# Patient Record
Sex: Female | Born: 1990 | Race: White | Hispanic: No | Marital: Single | Smoking: Never smoker
Health system: Southern US, Community
[De-identification: ages and names within clinical notes are randomized; demographics above are authoritative.]

## PROBLEM LIST (undated history)

## (undated) DIAGNOSIS — F909 Attention-deficit hyperactivity disorder, unspecified type: Secondary | ICD-10-CM

## (undated) DIAGNOSIS — I456 Pre-excitation syndrome: Secondary | ICD-10-CM

## (undated) HISTORY — PX: TONSILLECTOMY: SUR1361

## (undated) HISTORY — PX: CARDIAC ELECTROPHYSIOLOGY MAPPING AND ABLATION: SHX1292

## (undated) HISTORY — PX: APPENDECTOMY: SHX54

---
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
## 2011-04-08 NOTE — Op Note (Signed)
NAMEMarland Kitchen  ONEDA, DUFFETT           ACCOUNT NO.:  [id number]   MEDICAL RECORD NO.:  [id number]          PATIENT TYPE:  OBV   LOCATION:  2550                         FACILITY:  [facility]   PHYSICIAN:  Wilmon Arms. Corliss Skains, M.D. DATE OF BIRTH:  1991/09/04   DATE OF PROCEDURE:  04/05/2007  DATE OF DISCHARGE:                               OPERATIVE REPORT   PREOPERATIVE DIAGNOSIS:  Acute appendicitis.   POSTOPERATIVE DIAGNOSIS:  Acute appendicitis.   PROCEDURE PERFORMED:  Laparoscopic appendectomy.   SURGEON:  Wilmon Arms. Tsuei, M.D.   ANESTHESIA:  General endotracheal.   INDICATIONS:  The patient is a 20 year old female who presents with 3  days of right lower quadrant pain.  CT scan confirmed the diagnosis of  acute appendicitis.  White count was 9.6.   DESCRIPTION OF PROCEDURE:  The patient was brought to the operating room  and placed in a supine position on the operating room table with her  left arm tucked.  After an adequate level of general anesthesia was  obtained, a Foley catheter was placed under sterile technique.  The  patient's abdomen was prepped with Betadine and draped in a sterile  fashion.  A time-out was taken to ensure the proper patient and proper  procedure.  Her umbilicus was infiltrated with 0.25% Marcaine.  A  curvilinear incision was made below her umbilicus.  Dissection was  carried down to the fascia, which was opened vertically.  The peritoneum  was bluntly entered.  A stay suture of 0 Vicryl was placed around the  fascial opening.  The Hasson cannula was inserted and secured with a  stay suture.  Pneumoperitoneum was obtained by insufflating CO2 and  maintaining a maximum pressure of 15 mmHg.  The laparoscope was inserted  and no gross purulence was noted.  The liver, gallbladder, stomach and  right colon all appeared normal.  A 5-mm port was placed in the right  upper quadrant and a 5-mm port was placed in left lower quadrant.  The  laparoscope was exchanged  for a 5-mm 30-degree scope; this was moved to  the right upper quadrant port site.  The cecum was grasped with a  Glassman clamp and mobilized medially.  We were able to identify a short  thickened appendix with no sign of perforation.  There was some minimal  fibrinous exudate on the appendix.  The appendix was grasped with a  Babcock clamp and elevated.  We were able to bluntly dissect the  appendix way from the surrounding tissue.  The appendix base was  identified.  The mesoappendix was taken with the harmonic scalpel.  The  base of the appendix was then transected with an Endo-GIA stapler.  The  right lower quadrant was then thoroughly irrigated.  The appendix was  placed in the EndoCatch sac and removed through the umbilical port site.  We reinspected the right lower quadrant.  The staple line was intact  with no sign of bleeding or leak.  The irrigant was suctioned out as  much as possible.  We were able to visually identify both ovaries, which  appeared normal with no sign  of cyst.  The uterus appeared normal.  Pneumoperitoneum was then released as the ports were removed under  direct vision.  The fascial opening was closed with the pursestring  suture of the umbilicus.  A 4-0 Monocryl  was used to close all the skin incisions.  Steri-Strips and clean  dressings were applied.  The Foley catheter was removed prior to the  patient's extubation.  She was extubated and brought to Recovery in  stable condition.  All sponge, instrument and needle counts were  correct.      Wilmon Arms. Tsuei, M.D.  Electronically Signed     MKT/MEDQ  D:  04/05/2007  T:  04/06/2007  Job:  [id number]

## 2011-04-08 NOTE — H&P (Signed)
NAMEMarland Kitchen  Bethany Garza, Bethany Garza           ACCOUNT NO.:  [id number]   MEDICAL RECORD NO.:  [id number]          PATIENT TYPE:  OBV   LOCATION:  2550                         FACILITY:  [facility]   PHYSICIAN:  Wilmon Arms. Corliss Skains, M.D. DATE OF BIRTH:  1991-02-24   DATE OF ADMISSION:  04/05/2007  DATE OF DISCHARGE:                              HISTORY & PHYSICAL   CHIEF COMPLAINT:  Right lower quadrant pain   HISTORY OF PRESENT ILLNESS:  The patient is a 20 year old female who is  in good health who began experiencing some right lower quadrant pain  three days ago.  The pain has remained in place and has slowly worsened.  She reports subjective fevers but no objective temperature was taken.  She has some chronic constipation and denies any diarrhea.  She has had  decreased appetite for the last couple of days.  She came to emergency  department after being seen by her primary care physician.  Her primary  care doctor ordered a CT scan performed at [facility] which  showed signs of acute appendicitis.  We were asked to see her in the  emergency department.   PAST MEDICAL HISTORY:  Hyperhidrosis.   PAST SURGICAL HISTORY:  Tonsillectomy.   FAMILY HISTORY:  Noncontributory.   SOCIAL HISTORY:  The patient is a tenth grader.   ALLERGIES:  NONE.   MEDICATIONS:  Glycopyrrolate.   PHYSICAL EXAMINATION:  VITAL SIGNS:  Temperature 97.0, pulse 86,  respirations 16, blood pressure 117/87.  GENERAL:  This is a well-developed, well-nourished female in no apparent  distress.  HEENT:  EOMI.  Sclerae anicteric.  NECK:  No mass or thyromegaly.  LUNGS:  Clear.  Normal respiratory effort.  HEART:  Regular rate and rhythm.  No murmur.  ABDOMEN:  Tender in the right lower quadrant.  Positive Rovsing's sign,  negative psoas sign, positive bowel sounds.  EXTREMITIES:  No edema.  SKIN:  Warm and dry with no sign of jaundice.   LABORATORY DATA:  White count 9.6, 11.5 hemoglobin, platelet count 182.  Electrolytes within normal limits.   IMPRESSION:  Acute appendicitis.   PLAN:  Laparoscopic appendectomy, possible open.  I discussed the  benefits and risks of the procedure with the patient and her mother.  They understand and wish to proceed.      Wilmon Arms. Tsuei, M.D.  Electronically Signed     MKT/MEDQ  D:  04/05/2007  T:  04/06/2007  Job:  [id number]

## 2011-04-11 NOTE — Discharge Summary (Signed)
NAMEMarland Kitchen  LETONYA, MANGELS NO.:  [id number]   MEDICAL RECORD NO.:  [id number]          PATIENT TYPE:  OBV   LOCATION:  6122                         FACILITY:  [facility]   PHYSICIAN:  Cherylynn Ridges, M.D.    DATE OF BIRTH:  04-07-1991   DATE OF ADMISSION:  04/05/2007  DATE OF DISCHARGE:  04/06/2007                               DISCHARGE SUMMARY   DISCHARGING PHYSICIAN:  Cherylynn Ridges, M.D.   CHIEF COMPLAINT/REASON FOR ADMISSION:  Ms. Thull is a 20 year old  female otherwise in good health reporting 3 days of right lower quadrant  pain associated with anorexia.  She initially presented to her primary  care physician who referred her to the ER for concerns of an acute  appendicitis.  CT scan was performed at [facility] which did  demonstrate signs of acute appendicitis.  In the ER the patient was  afebrile, vital signs were stable.  Her abdomen was tender in the right  lower quadrant with positive Rovsing sign.  Negative psoas sign.  Bowel  sounds were present.  Her white count was 9400.  Her CT was consistent  with appendicitis.   ADMITTING DIAGNOSIS:  Acute appendicitis.   HOSPITAL COURSE:  The patient was taken from the ER to the OR where she  underwent laparoscopic appendectomy for acute nonperforated  appendicitis; and was sent back to the general floor to recovery.   Postop day #1 the patient's vital signs were stable.  She was afebrile.  She was tolerating clear liquids and diet was advanced; and she  tolerated this as well as advanced to oral pain medications.  Her  abdomen was soft and flat with bowel sounds present.  She was deemed  appropriate for discharge home.   FINAL DISCHARGE DIAGNOSES:  1. Acute nonperforated appendicitis.  2. Status post laparoscopic appendectomy.   DISCHARGE MEDICATIONS:  1. Vicodin 1-2 tablets q.4 h. as needed for pain.  2. Over-the-counter ibuprofen q.8 h. as needed for pain.  3. Over-the-counter stool softener  twice daily while taking Vicodin to      prevent constipation.   OTHER INSTRUCTIONS:  Please refer to the home care instructions for  laparoscopic appendectomy in the chart.  Additional written instructions  include no driving while taking Vicodin.  Return to school in 1 week.  No PE for 2 weeks.   FOLLOWUP:  You need to follow up with Dr. Corliss Skains in the office in 2-3  weeks.  You need to call for appointment.      Allison L. Rennis Harding, N.P.      Cherylynn Ridges, M.D.  Electronically Signed    ALE/MEDQ  D:  04/28/2007  T:  04/28/2007  Job:  [id number]   cc:   Wilmon Arms. Tsuei, M.D.

## 2011-09-02 LAB — URINALYSIS, ROUTINE W REFLEX MICROSCOPIC
Bilirubin Urine: NEGATIVE
Glucose, UA: NEGATIVE
Hgb urine dipstick: NEGATIVE
Ketones, ur: NEGATIVE
Nitrite: NEGATIVE
Protein, ur: NEGATIVE
Specific Gravity, Urine: 1.024
Urobilinogen, UA: 0.2
pH: 5

## 2011-09-02 LAB — WET PREP, GENITAL
Trich, Wet Prep: NONE SEEN
Yeast Wet Prep HPF POC: NONE SEEN

## 2011-09-02 LAB — GC/CHLAMYDIA PROBE AMP, GENITAL
Chlamydia, DNA Probe: NEGATIVE
GC Probe Amp, Genital: NEGATIVE

## 2011-09-02 LAB — POCT PREGNANCY, URINE
Operator id: 27011
Preg Test, Ur: NEGATIVE

## 2012-08-06 ENCOUNTER — Emergency Department
Admission: EM | Admit: 2012-08-06 | Discharge: 2012-08-06 | Disposition: A | Discharge status: Home / Self Care | Payer: BC Managed Care – PPO | Source: Home / Self Care | Attending: Family Medicine | Admitting: Family Medicine

## 2012-08-06 ENCOUNTER — Encounter: Payer: Self-pay | Admitting: Emergency Medicine

## 2012-08-06 ENCOUNTER — Emergency Department (INDEPENDENT_AMBULATORY_CARE_PROVIDER_SITE_OTHER): Payer: BC Managed Care – PPO

## 2012-08-06 DIAGNOSIS — M7040 Prepatellar bursitis, unspecified knee: Secondary | ICD-10-CM

## 2012-08-06 DIAGNOSIS — W19XXXA Unspecified fall, initial encounter: Secondary | ICD-10-CM

## 2012-08-06 DIAGNOSIS — M7042 Prepatellar bursitis, left knee: Secondary | ICD-10-CM

## 2012-08-06 DIAGNOSIS — M25569 Pain in unspecified knee: Secondary | ICD-10-CM

## 2012-08-06 HISTORY — DX: Attention-deficit hyperactivity disorder, unspecified type: F90.9

## 2012-08-06 HISTORY — DX: Pre-excitation syndrome: I45.6

## 2012-08-06 NOTE — ED Provider Notes (Signed)
History     CSN: [id number]  Arrival date & time 08/06/12  1546   First MD Initiated Contact with Patient 08/06/12 1610      Chief Complaint  Patient presents with  . Knee Pain      HPI Comments: Patient reports falling on left knee directly onto concrete on 07/11/12; after abrasion/scab came off noticed lumps that did not resolve; also has pain which is worse with weight bearing and makes running and kneeling impossible.  Patient is a 21 y.o. female presenting with knee pain. The history is provided by the patient.  Knee Pain This is a new problem. Episode onset: 3 weeks ago. The problem occurs constantly. The problem has been gradually improving. Associated symptoms comments: none. Exacerbated by: flexing left knee and kneeling. Nothing relieves the symptoms. Treatments tried: Ibuprofen. The treatment provided mild relief.    Past Medical History  Diagnosis Date  . ADHD (attention deficit hyperactivity disorder)   . Wolf-Parkinson-White syndrome     Past Surgical History  Procedure Date  . Tonsillectomy   . Appendectomy   . Cardiac electrophysiology mapping and ablation     Family History  Problem Relation Age of Onset  . Diabetes Father     History  Substance Use Topics  . Smoking status: Never Smoker   . Smokeless tobacco: Not on file  . Alcohol Use: Yes    OB History    Grav Para Term Preterm Abortions TAB SAB Ect Mult Living                  Review of Systems  All other systems reviewed and are negative.    Allergies  Review of patient's allergies indicates no known allergies.  Home Medications   Current Outpatient Rx  Name Route Sig Dispense Refill  . AMPHETAMINE-DEXTROAMPHET ER 20 MG PO CP24 Oral Take 20 mg by mouth every morning.    Marland Kitchen AMPHETAMINE-DEXTROAMPHETAMINE 10 MG PO TABS Oral Take 10 mg by mouth continuous as needed.    . IBUPROFEN 400 MG PO TABS Oral Take 400 mg by mouth every 6 (six) hours as needed.    Marland Kitchen LEVONORGESTREL 20 MCG/24HR  IU IUD Intrauterine 1 each by Intrauterine route once.      BP 107/71  Pulse 74  Temp 98 F (36.7 C) (Oral)  Resp 16  Ht 5\' 2"  (1.575 m)  Wt 146 lb 12 oz (66.565 kg)  BMI 26.84 kg/m2  SpO2 98%  Physical Exam  Constitutional: She is oriented to person, place, and time. She appears well-developed and well-nourished. No distress.  HENT:  Head: Normocephalic.  Eyes: Conjunctivae normal are normal. Pupils are equal, round, and reactive to light.  Musculoskeletal: Normal range of motion. She exhibits tenderness.       Left knee: She exhibits swelling and bony tenderness. She exhibits normal range of motion, no effusion, no ecchymosis, no deformity, no laceration, no erythema, normal alignment, no LCL laxity, normal patellar mobility, normal meniscus and no MCL laxity. tenderness found. No medial joint line, no lateral joint line, no MCL, no LCL and no patellar tendon tenderness noted.       Legs:      There is tenderness over lower pole of left patella.  Mild crepitus present in pre-patellar bursa.  No erythema or warmth present.  Neurological: She is alert and oriented to person, place, and time.  Skin: Skin is warm and dry. No rash noted.    ED Course  Procedures  none  Dg Knee 4 Views W/patella Left  08/06/2012  *RADIOLOGY REPORT*  Clinical Data: Fall, knee pain.  LEFT KNEE - COMPLETE 4+ VIEW  Comparison: None  Findings: No acute bony abnormality.  Specifically, no fracture, subluxation, or dislocation.  Soft tissues are intact. Joint spaces are maintained.  Normal bone mineralization.  No joint effusion.  IMPRESSION: Normal study.   Original Report Authenticated By: Cyndie Chime, M.D.      1. Prepatellar bursitis of left knee       MDM  Ace wrap applied. Apply ice pack 2 or 3 times daily.  Wear ace wrap.  Begin knee exercises as per instructions.  May take Aleve, 2 tabs every 12 hours with food. Followup with [facility] if not improving about two weeks.          Lattie Haw, MD 08/11/12 [phone]

## 2012-08-06 NOTE — ED Notes (Signed)
Patient reports falling on left knee directly onto concrete on 07/11/12; after abrasion/scab came off noticed lumps that did not resolve; also has pain which is worse with weight bearing and makes running and kneeling impossible.

## 2013-04-13 IMAGING — CR DG KNEE COMPLETE 4+V*L*
5 series · 5 of 5 positions shown · non-contrast
Comparison: None

CLINICAL DATA: Fall, knee pain.

LEFT KNEE - COMPLETE 4+ VIEW

[view not recorded (1 of 5)]
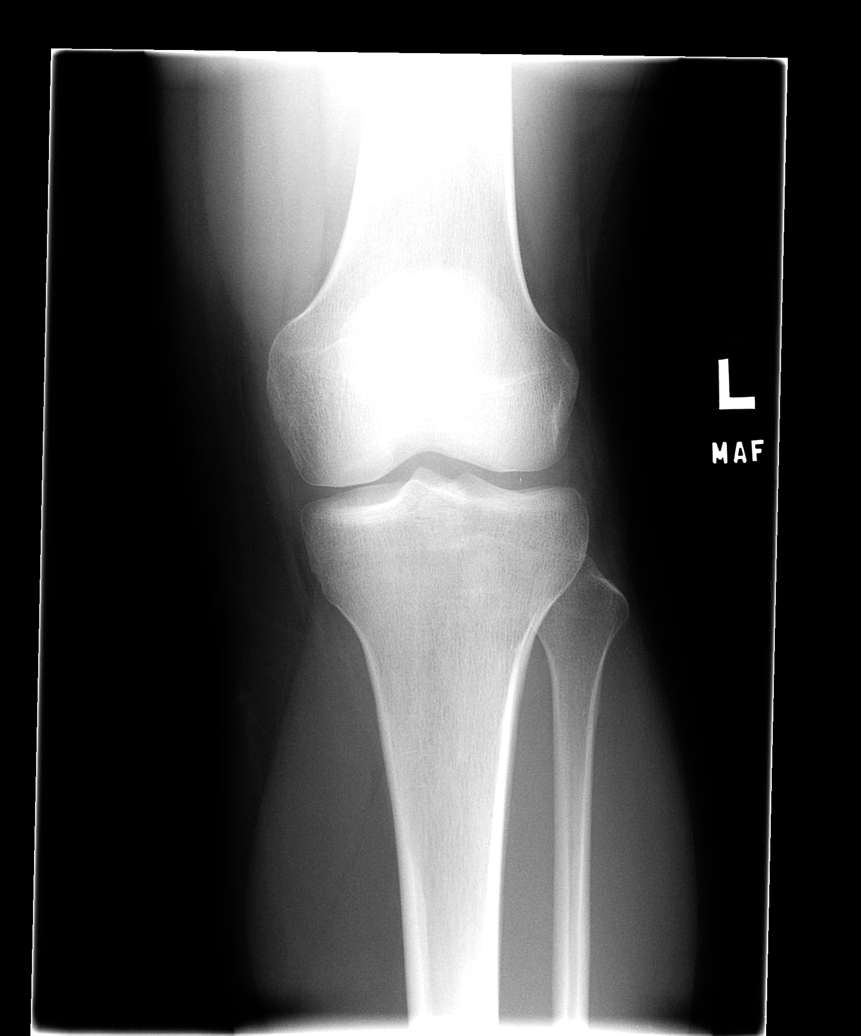

[view not recorded (2 of 5)]
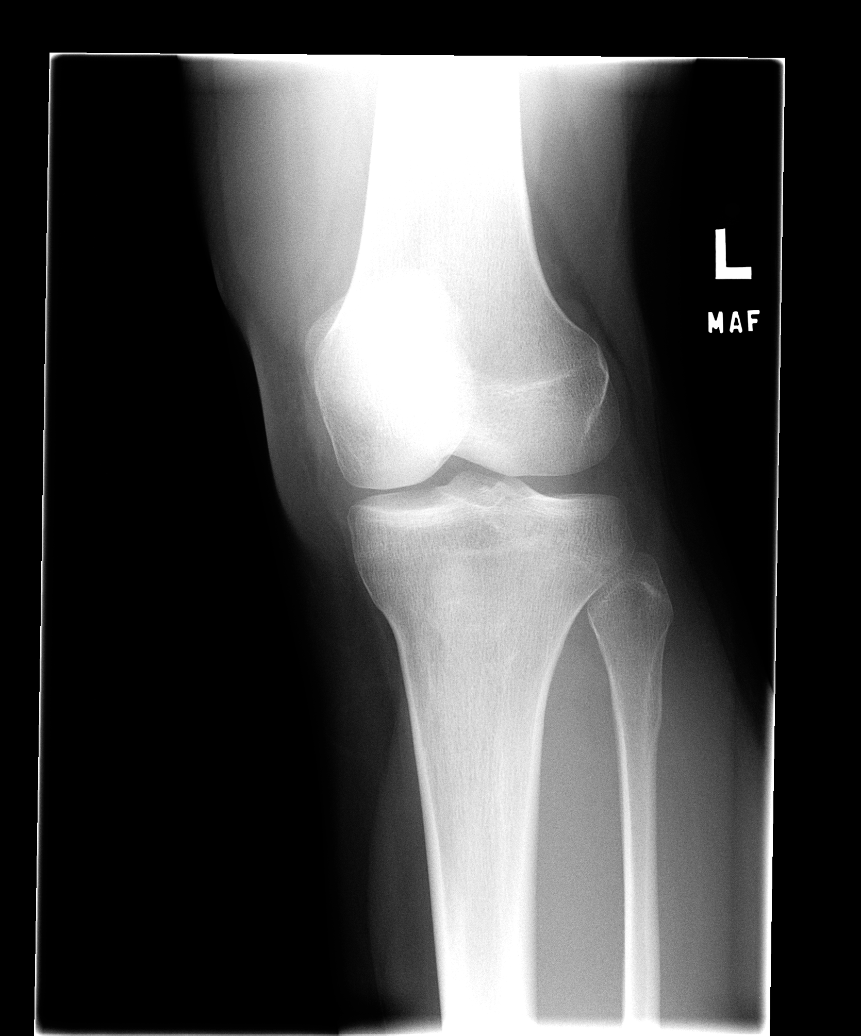

[view not recorded (3 of 5)]
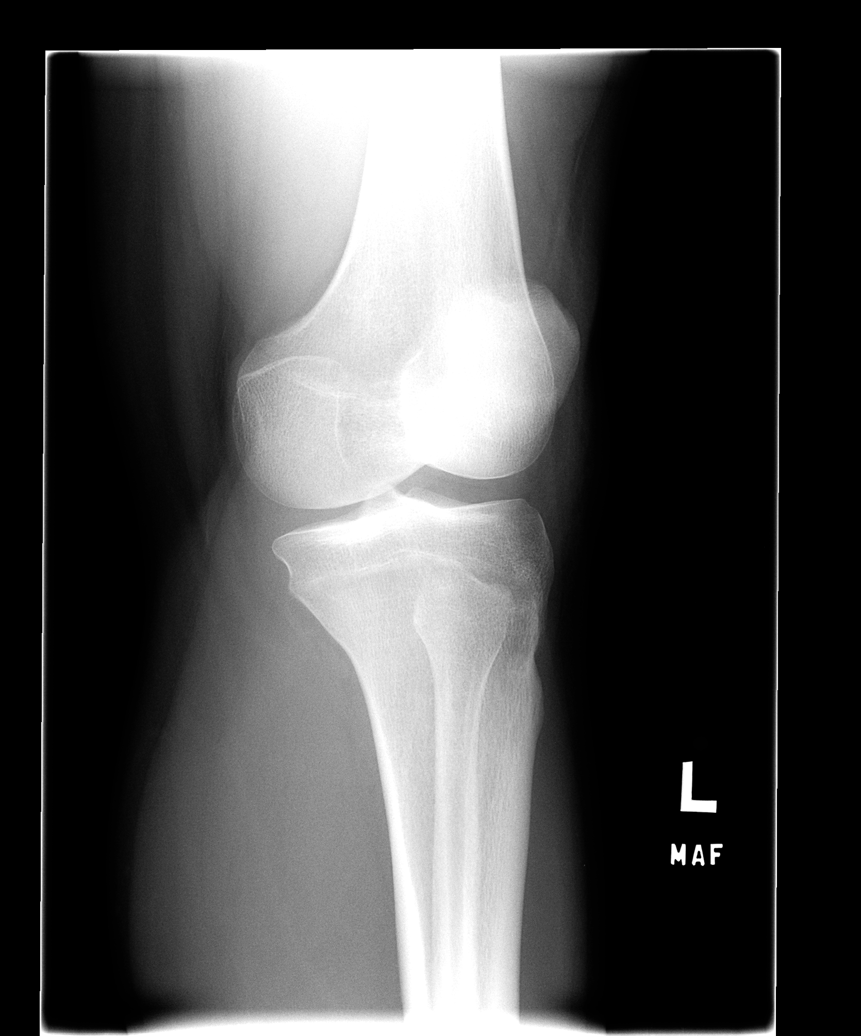

[view not recorded (4 of 5)]
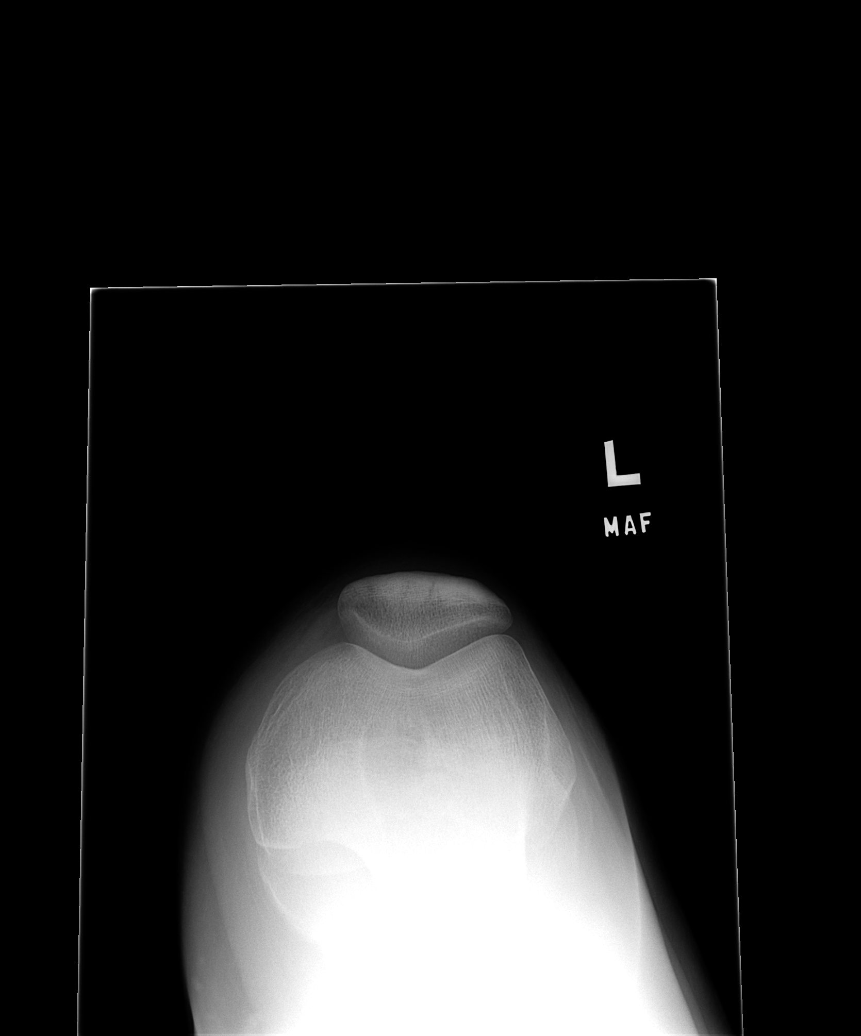

[view not recorded (5 of 5)]
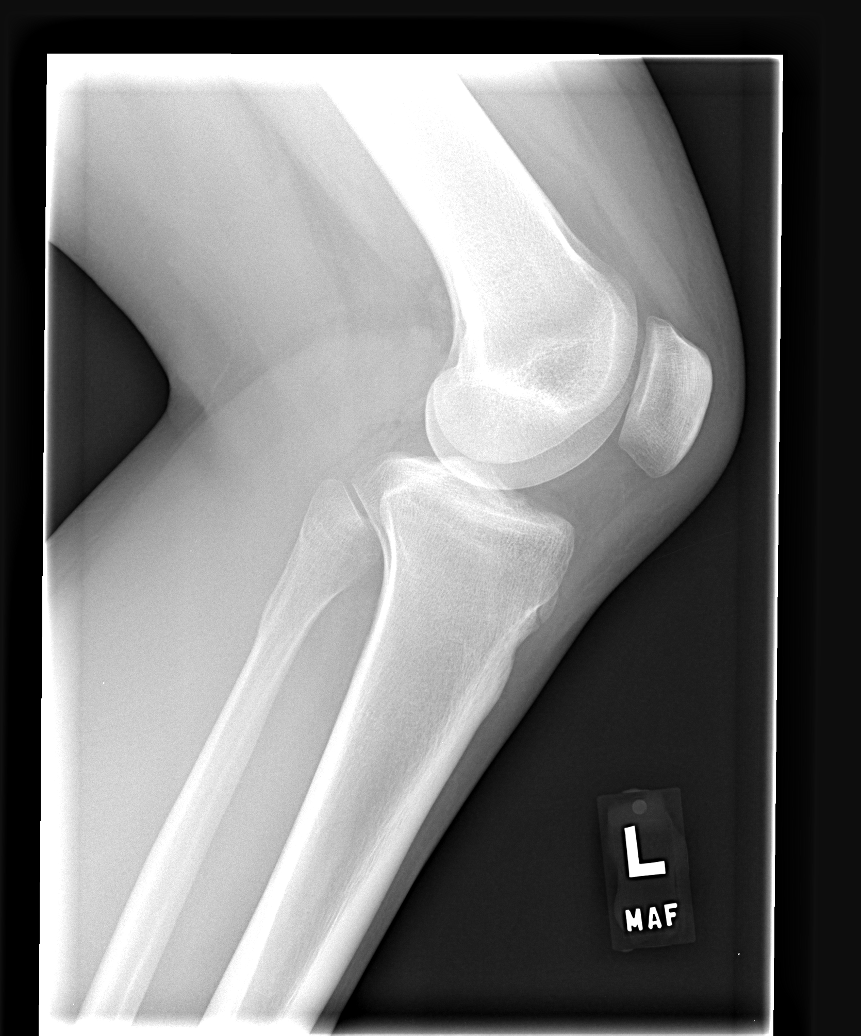

[5 of 5 positions shown; findings below may reference images not displayed]

FINDINGS: No acute bony abnormality.  Specifically, no fracture,
subluxation, or dislocation.  Soft tissues are intact. Joint spaces
are maintained.  Normal bone mineralization.  No joint effusion.
IMPRESSION: Normal study.

## 2014-03-08 DIAGNOSIS — F909 Attention-deficit hyperactivity disorder, unspecified type: Secondary | ICD-10-CM | POA: Insufficient documentation

## 2017-06-09 DIAGNOSIS — Z975 Presence of (intrauterine) contraceptive device: Secondary | ICD-10-CM | POA: Insufficient documentation

## 2018-10-05 ENCOUNTER — Encounter: Payer: Self-pay | Admitting: Family

## 2018-10-05 ENCOUNTER — Ambulatory Visit (INDEPENDENT_AMBULATORY_CARE_PROVIDER_SITE_OTHER): Payer: BLUE CROSS/BLUE SHIELD | Admitting: Family

## 2018-10-05 ENCOUNTER — Telehealth: Payer: Self-pay | Admitting: Family

## 2018-10-05 VITALS — BP 106/71 | HR 70 | Temp 97.3°F | Ht 62.0 in | Wt 153.4 lb

## 2018-10-05 DIAGNOSIS — H00011 Hordeolum externum right upper eyelid: Secondary | ICD-10-CM

## 2018-10-05 MED ORDER — BACITRACIN 500 UNIT/GM OP OINT: 1.0000 "application " | TOPICAL_OINTMENT | Freq: Four times a day (QID) | OPHTHALMIC | 0 refills | Status: DC

## 2018-10-05 MED ORDER — ERYTHROMYCIN 5 MG/GM OP OINT: 1.0000 "application " | TOPICAL_OINTMENT | Freq: Four times a day (QID) | OPHTHALMIC | 0 refills | Status: DC

## 2018-10-05 NOTE — Telephone Encounter (Signed)
Please advise 

## 2018-10-05 NOTE — Progress Notes (Signed)
   Subjective:    Patient ID: Bethany Garza, female    DOB: Feb 25, 1991, 27 y.o.   MRN: [id number]  Chief Complaint  Patient presents with  . irritation right eye  . New Patient (Initial Visit)    Conjunctivitis   The current episode started today. The onset was sudden. The problem has been unchanged. The problem is mild. Associated symptoms include eye itching, photophobia and eye discharge. Pertinent negatives include no decreased vision, no double vision, no ear discharge, no headaches, no hearing loss and no eye redness. The eye pain is mild. The right eye is affected.      Review of Systems  HENT: Negative for ear discharge and hearing loss.   Eyes: Positive for photophobia, discharge and itching. Negative for double vision and redness.  Neurological: Negative for headaches.  All other systems reviewed and are negative.      Objective:   Physical Exam  Constitutional: She is oriented to person, place, and time. She appears well-developed and well-nourished. No distress.  HENT:  Head: Normocephalic and atraumatic.  Right Ear: External ear normal.  Left Ear: External ear normal.  Mouth/Throat: Oropharynx is clear and moist.  Eyes: Pupils are equal, round, and reactive to light. Right eye exhibits hordeolum (swelling in upper lid).  Neck: Normal range of motion. Neck supple. No thyromegaly present.  Cardiovascular: Normal rate, regular rhythm, normal heart sounds and intact distal pulses.  No murmur heard. Pulmonary/Chest: Effort normal and breath sounds normal. No respiratory distress. She has no wheezes.  Abdominal: Soft. Bowel sounds are normal. She exhibits no distension. There is no tenderness.  Musculoskeletal: Normal range of motion. She exhibits no edema or tenderness.  Neurological: She is alert and oriented to person, place, and time. She has normal reflexes. No cranial nerve deficit.  Skin: Skin is warm and dry.  Psychiatric: She has a normal mood and  affect. Her behavior is normal. Judgment and thought content normal.  Vitals reviewed.     BP 106/71   Pulse 70   Temp (!) 97.3 F (36.3 C) (Oral)   Ht 5\' 2"  (1.575 m)   Wt 153 lb 6.4 oz (69.6 kg)   BMI 28.06 kg/m      Assessment & Plan:  Bethany BangStacey N Weilbacher comes in today with chief complaint of irritation right eye and New Patient (Initial Visit)   Diagnosis and orders addressed:  1. Hordeolum of right upper eyelid, unspecified hordeolum type Warm compression Good hand hygiene discussed RTO if symptoms worsen or do not improve - bacitracin ophthalmic ointment; Place 1 application into the right eye 4 (four) times daily. apply to eye  Dispense: 3.5 g; Refill: 0   Jannifer Rodneyhristy Danyel Tobey, FNP

## 2018-10-05 NOTE — Telephone Encounter (Signed)
Erythromycin Prescription sent to pharmacy

## 2018-10-05 NOTE — Patient Instructions (Signed)

## 2018-10-07 ENCOUNTER — Ambulatory Visit (INDEPENDENT_AMBULATORY_CARE_PROVIDER_SITE_OTHER): Payer: BLUE CROSS/BLUE SHIELD | Admitting: Family

## 2018-10-07 ENCOUNTER — Encounter: Payer: Self-pay | Admitting: Family

## 2018-10-07 VITALS — BP 106/76 | HR 86 | Temp 97.2°F | Ht 62.0 in | Wt 153.0 lb

## 2018-10-07 DIAGNOSIS — Z0000 Encounter for general adult medical examination without abnormal findings: Secondary | ICD-10-CM

## 2018-10-07 DIAGNOSIS — Z79899 Other long term (current) drug therapy: Secondary | ICD-10-CM

## 2018-10-07 DIAGNOSIS — F411 Generalized anxiety disorder: Secondary | ICD-10-CM

## 2018-10-07 DIAGNOSIS — Z23 Encounter for immunization: Secondary | ICD-10-CM

## 2018-10-07 DIAGNOSIS — Z020 Encounter for examination for admission to educational institution: Secondary | ICD-10-CM

## 2018-10-07 DIAGNOSIS — F900 Attention-deficit hyperactivity disorder, predominantly inattentive type: Secondary | ICD-10-CM

## 2018-10-07 DIAGNOSIS — F1320 Sedative, hypnotic or anxiolytic dependence, uncomplicated: Secondary | ICD-10-CM

## 2018-10-07 MED ORDER — AMPHETAMINE-DEXTROAMPHET ER 20 MG PO CP24: 20.0000 mg | ORAL_CAPSULE | Freq: Every day | ORAL | 0 refills | Status: DC

## 2018-10-07 MED ORDER — AMPHETAMINE-DEXTROAMPHET ER 20 MG PO CP24
20.0000 mg | ORAL_CAPSULE | Freq: Every day | ORAL | 0 refills | Status: DC
Start: 2018-10-07 — End: 2019-03-22

## 2018-10-07 MED ORDER — AMPHETAMINE-DEXTROAMPHET ER 20 MG PO CP24: 20.0000 mg | ORAL_CAPSULE | ORAL | 0 refills | Status: DC

## 2018-10-07 MED ORDER — AMPHETAMINE-DEXTROAMPHETAMINE 10 MG PO TABS: 10.0000 mg | ORAL_TABLET | ORAL | 0 refills | Status: DC | PRN

## 2018-10-07 MED ORDER — ALPRAZOLAM 0.25 MG PO TABS: 0.2500 mg | ORAL_TABLET | Freq: Every evening | ORAL | 1 refills | Status: DC | PRN

## 2018-10-07 NOTE — Progress Notes (Signed)
Subjective:    Patient ID: Bethany Garza, female    DOB: 08/19/1991, 27 y.o.   MRN: [id number]  Chief Complaint  Patient presents with  . college forms,CPE   Pt presents to the office today to establish care and CPE. 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    She has ADHD and is currently taking Adderall 20 mg or Adderall 10 mg as needed on days she works or is in school. She states this helps her to stay focused and to complete tasks. She has been on these current doses since 2011. Denies any adverse effects.  Anxiety  Presents for initial visit. Onset was more than 5 years ago. The problem has been waxing and waning. Symptoms include decreased concentration, excessive worry, irritability, nervous/anxious behavior and restlessness. Symptoms occur most days. The severity of symptoms is moderate. The quality of sleep is good.   Past treatments include lifestyle changes and benzodiazephines. The treatment provided moderate relief. Compliance with prior treatments has been good.      Review of Systems  Constitutional: Positive for irritability.  Psychiatric/Behavioral: Positive for decreased concentration. The patient is nervous/anxious.   All other systems reviewed and are negative.  Family History  Problem Relation Age of Onset  . Diabetes Father    Social History   Socioeconomic History  . Marital status: Single    Spouse name: Not on file  . Number of children: Not on file  . Years of education: Not on file  . Highest education level: Not on file  Occupational History  . Not on file  Social Needs  . Financial resource strain: Not on file  . Food insecurity:    Worry: Not on file    Inability: Not on file  . Transportation needs:    Medical: Not on file    Non-medical: Not on file  Tobacco Use  . Smoking status: Never Smoker  . Smokeless tobacco: Never Used  Substance and Sexual Activity  .  Alcohol use: Yes  . Drug use: No  . Sexual activity: Not on file  Lifestyle  . Physical activity:    Days per week: Not on file    Minutes per session: Not on file  . Stress: Not on file  Relationships  . Social connections:    Talks on phone: Not on file    Gets together: Not on file    Attends religious service: Not on file    Active member of club or organization: Not on file    Attends meetings of clubs or organizations: Not on file    Relationship status: Not on file  Other Topics Concern  . Not on file  Social History Narrative  . Not on file       Objective:   Physical Exam  Constitutional: She is oriented to person, place, and time. She appears well-developed and well-nourished. No distress.  HENT:  Head: Normocephalic and atraumatic.  Right Ear: External ear normal.  Left Ear: External ear normal.  Mouth/Throat: Oropharynx is clear and moist.  Eyes: Pupils are equal, round, and reactive to light.  Neck: Normal range of motion. Neck supple. No thyromegaly present.  Cardiovascular: Normal rate, regular rhythm, normal heart sounds and intact distal pulses.  No murmur heard. Pulmonary/Chest: Effort normal and breath sounds normal. No respiratory distress. She has no wheezes.  Abdominal: Soft. Bowel sounds are normal. She exhibits no distension. There is no  tenderness.  Musculoskeletal: Normal range of motion. She exhibits no edema or tenderness.  Neurological: She is alert and oriented to person, place, and time. She has normal reflexes. No cranial nerve deficit.  Skin: Skin is warm and dry.  Psychiatric: She has a normal mood and affect. Her behavior is normal. Judgment and thought content normal.  Vitals reviewed.   BP 106/76   Pulse 86   Temp (!) 97.2 F (36.2 C) (Oral)   Ht _0  (1.575 m)   Wt 153 lb (69.4 kg)   BMI 27.98 kg/m      Assessment & Plan:  [name] comes in today with chief complaint of college forms,CPE   Diagnosis and orders  addressed:  1. Annual physical exam - CMP14+EGFR - CBC with Differential/Platelet - Lipid panel - TSH - Varicella zoster antibody, IgG - QuantiFERON-TB Gold Plus  2. Attention deficit hyperactivity disorder (ADHD), predominantly inattentive type Meds as prescribed Behavior modification as needed Follow-up for recheck in 3 months - CMP14+EGFR - CBC with Differential/Platelet - amphetamine-dextroamphetamine (ADDERALL) 10 MG tablet; Take 1 tablet (10 mg total) by mouth continuous as needed.  Dispense: 30 tablet; Refill: 0 - amphetamine-dextroamphetamine (ADDERALL XR) 20 MG 24 hr capsule; Take 1 capsule (20 mg total) by mouth every morning.  Dispense: 30 capsule; Refill: 0 - amphetamine-dextroamphetamine (ADDERALL XR) 20 MG 24 hr capsule; Take 1 capsule (20 mg total) by mouth daily.  Dispense: 30 capsule; Refill: 0 - amphetamine-dextroamphetamine (ADDERALL XR) 20 MG 24 hr capsule; Take 1 capsule (20 mg total) by mouth daily.  Dispense: 30 capsule; Refill: 0  3. GAD (generalized anxiety disorder) Stress management discussed - CMP14+EGFR - CBC with Differential/Platelet - ALPRAZolam (XANAX) 0.25 MG tablet; Take 1 tablet (0.25 mg total) by mouth at bedtime as needed for anxiety.  Dispense: 30 tablet; Refill: 1  4. Encounter for school examination - CMP14+EGFR - Varicella zoster antibody, IgG - QuantiFERON-TB Gold Plus  5. Controlled substance agreement signed  6. Benzodiazepine dependence ([place])   Labs pending Health Maintenance reviewed Diet and exercise encouraged  Follow up plan: 3 months    Evelina Dun, FNP

## 2018-10-07 NOTE — Patient Instructions (Signed)

## 2018-10-08 LAB — CBC WITH DIFFERENTIAL/PLATELET
Basophils Absolute: 0.1 10*3/uL (ref 0.0–0.2)
Basos: 1 %
EOS (ABSOLUTE): 0.1 10*3/uL (ref 0.0–0.4)
Eos: 2 %
Hematocrit: 38.7 % (ref 34.0–46.6)
Hemoglobin: 13.2 g/dL (ref 11.1–15.9)
Immature Grans (Abs): 0 10*3/uL (ref 0.0–0.1)
Immature Granulocytes: 0 %
Lymphocytes Absolute: 2.2 10*3/uL (ref 0.7–3.1)
Lymphs: 40 %
MCH: 30.2 pg (ref 26.6–33.0)
MCHC: 34.1 g/dL (ref 31.5–35.7)
MCV: 89 fL (ref 79–97)
Monocytes Absolute: 0.5 10*3/uL (ref 0.1–0.9)
Monocytes: 10 %
Neutrophils Absolute: 2.6 10*3/uL (ref 1.4–7.0)
Neutrophils: 47 %
Platelets: 207 10*3/uL (ref 150–450)
RBC: 4.37 x10E6/uL (ref 3.77–5.28)
RDW: 12.4 % (ref 12.3–15.4)
WBC: 5.5 10*3/uL (ref 3.4–10.8)

## 2018-10-08 LAB — CMP14+EGFR
ALT: 10 IU/L (ref 0–32)
AST: 17 IU/L (ref 0–40)
Albumin/Globulin Ratio: 2 (ref 1.2–2.2)
Albumin: 4.4 g/dL (ref 3.5–5.5)
Alkaline Phosphatase: 59 IU/L (ref 39–117)
BUN/Creatinine Ratio: 12 (ref 9–23)
BUN: 9 mg/dL (ref 6–20)
Bilirubin Total: 0.3 mg/dL (ref 0.0–1.2)
CO2: 23 mmol/L (ref 20–29)
Calcium: 9.4 mg/dL (ref 8.7–10.2)
Chloride: 104 mmol/L (ref 96–106)
Creatinine, Ser: 0.77 mg/dL (ref 0.57–1.00)
GFR calc Af Amer: 122 mL/min/{1.73_m2} (ref >59)
GFR calc non Af Amer: 106 mL/min/{1.73_m2} (ref >59)
Globulin, Total: 2.2 g/dL (ref 1.5–4.5)
Glucose: 94 mg/dL (ref 65–99)
Potassium: 4.1 mmol/L (ref 3.5–5.2)
Sodium: 144 mmol/L (ref 134–144)
Total Protein: 6.6 g/dL (ref 6.0–8.5)

## 2018-10-08 LAB — LIPID PANEL
Chol/HDL Ratio: 2.9 ratio (ref 0.0–4.4)
Cholesterol, Total: 171 mg/dL (ref 100–199)
HDL: 59 mg/dL (ref >39)
LDL Calculated: 62 mg/dL (ref 0–99)
Triglycerides: 249 mg/dL — ABNORMAL HIGH (ref 0–149)
VLDL Cholesterol Cal: 50 mg/dL — ABNORMAL HIGH (ref 5–40)

## 2018-10-08 LAB — TSH: TSH: 1.4 u[IU]/mL (ref 0.450–4.500)

## 2018-10-08 LAB — VARICELLA ZOSTER ANTIBODY, IGG: Varicella zoster IgG: 1181 index (ref >165)

## 2018-10-11 LAB — QUANTIFERON-TB GOLD PLUS
QuantiFERON Mitogen Value: >10 IU/mL
QuantiFERON Nil Value: 0.04 IU/mL
QuantiFERON TB1 Ag Value: 0.05 IU/mL
QuantiFERON TB2 Ag Value: 0.05 IU/mL
QuantiFERON-TB Gold Plus: NEGATIVE

## 2018-10-12 ENCOUNTER — Encounter: Payer: Self-pay | Admitting: *Deleted

## 2018-11-25 DIAGNOSIS — Z029 Encounter for administrative examinations, unspecified: Secondary | ICD-10-CM

## 2018-12-01 ENCOUNTER — Telehealth: Payer: Self-pay | Admitting: Family

## 2018-12-01 NOTE — Telephone Encounter (Signed)
Informed patient that form  Is in Christy's basket to be signed and we will call her when it is complete

## 2018-12-02 ENCOUNTER — Other Ambulatory Visit: Payer: Self-pay | Admitting: Family

## 2018-12-03 ENCOUNTER — Telehealth: Payer: Self-pay | Admitting: Family

## 2018-12-03 NOTE — Telephone Encounter (Signed)
Neysa Bonito do you know anything about this form?

## 2018-12-07 NOTE — Telephone Encounter (Signed)
lmtcb

## 2018-12-07 NOTE — Telephone Encounter (Signed)
I completed this and was picked up. Not in my office at this time.

## 2018-12-07 NOTE — Telephone Encounter (Signed)
Left message , physical sheet is ready to pick up.  It had went to scan center.

## 2019-01-27 ENCOUNTER — Other Ambulatory Visit: Payer: Self-pay | Admitting: Family Medicine

## 2019-01-27 ENCOUNTER — Encounter: Payer: Self-pay | Admitting: Family Medicine

## 2019-01-27 ENCOUNTER — Ambulatory Visit (INDEPENDENT_AMBULATORY_CARE_PROVIDER_SITE_OTHER): Payer: BLUE CROSS/BLUE SHIELD | Admitting: Family Medicine

## 2019-01-27 ENCOUNTER — Ambulatory Visit (INDEPENDENT_AMBULATORY_CARE_PROVIDER_SITE_OTHER): Payer: BLUE CROSS/BLUE SHIELD

## 2019-01-27 VITALS — BP 105/76 | HR 79 | Temp 98.0°F | Ht 62.0 in | Wt 155.4 lb

## 2019-01-27 DIAGNOSIS — R0781 Pleurodynia: Secondary | ICD-10-CM

## 2019-01-27 MED ORDER — NAPROXEN 500 MG PO TABS: 500.0000 mg | ORAL_TABLET | Freq: Two times a day (BID) | ORAL | 1 refills | Status: DC

## 2019-01-27 NOTE — Patient Instructions (Signed)
Rib Contusion  A rib contusion is a deep bruise on your rib area. Contusions are the result of a blunt trauma that causes bleeding and injury to the tissues under the skin. A rib contusion may involve bruising of the ribs and of the skin and muscles in the area. The skin over the contusion may turn blue, purple, or yellow. Minor injuries will give you a painless contusion. More severe contusions may stay painful and swollen for a few weeks.  What are the causes?  This condition is usually caused by a blow, trauma, or direct force to an area of the body. This often occurs while playing contact sports.  What are the signs or symptoms?  Symptoms of this condition include:   Swelling and redness of the injured area.   Discoloration of the injured area.   Tenderness and soreness of the injured area.   Pain with or without movement.  How is this diagnosed?  This condition may be diagnosed based on:   Your symptoms and medical history.   A physical exam.   Imaging tests-such as an X-ray, CT scan, or MRI-to determine if there were internal injuries or broken bones (fractures).  How is this treated?  This condition may be treated with:   Rest. This is often the best treatment for a rib contusion.   Icing. This reduces swelling and inflammation.   Deep-breathing exercises. These may be recommended to reduce the risk for lung collapse and pneumonia.   Medicines. Over-the-counter or prescription medicines may be given to control pain.   Injection of a numbing medicine around the nerve near your injury (nerve block).  Follow these instructions at home:         Medicines   Take over-the-counter and prescription medicines only as told by your health care provider.   Do not drive or use heavy machinery while taking prescription pain medicine.   If you are taking prescription pain medicine, take actions to prevent or treat constipation. Your health care provider may recommend that you:  ? Drink enough fluid to keep  your urine pale yellow.  ? Eat foods that are high in fiber, such as fresh fruits and vegetables, whole grains, and beans.  ? Limit foods that are high in fat and processed sugars, such as fried or sweet foods.  ? Take an over-the-counter or prescription medicine for constipation.  Managing pain, stiffness, and swelling   If directed, put ice on the injured area:  ? Put ice in a plastic bag.  ? Place a towel between your skin and the bag.  ? Leave the ice on for 20 minutes, 2-3 times a day.   Rest the injured area. Avoid strenuous activity and any activities or movements that cause pain. Be careful during activities and avoid bumping the injured area.   Do not lift anything that is heavier than 5 lb (2.3 kg), or the limit that you are told, until your health care provider says that it is safe.  General instructions   Do not use any products that contain nicotine or tobacco, such as cigarettes and e-cigarettes. These can delay healing. If you need help quitting, ask your health care provider.   Do deep-breathing exercises as told by your health care provider.   If you were given an incentive spirometer, use it every 1-2 hours while you are awake, or as recommended by your health care provider. This device measures how well you are filling your lungs with each breath.     Keep all follow-up visits as told by your health care provider. This is important.  Contact a health care provider if you have:   Increased bruising or swelling.   Pain that is not controlled with treatment.   A fever.  Get help right away if you:   Have difficulty breathing or shortness of breath.   Develop a continual cough or you cough up thick or bloody sputum.   Feel nauseous or you vomit.   Have pain in your abdomen.  Summary   A rib contusion is a deep bruise on your rib area. Contusions are the result of a blunt trauma that causes bleeding and injury to the tissues under the skin.   The skin overlying the contusion may turn  blue, purple, or yellow. Minor injuries may give you a painless contusion. More severe contusions may stay painful and swollen for a few weeks.   Rest the injured area. Avoid strenuous activity and any activities or movements that cause pain.  This information is not intended to replace advice given to you by your health care provider. Make sure you discuss any questions you have with your health care provider.  Document Released: 08/05/2001 Document Revised: 12/09/2017 Document Reviewed: 12/09/2017  Elsevier Interactive Patient Education  2019 [employer]

## 2019-01-27 NOTE — Progress Notes (Signed)
Subjective:  Patient ID: Bethany Garza, female    DOB: 01-01-1991, 28 y.o.   MRN: [id number]  Chief Complaint:  Chest Pain (Left side, friend gave bear hug on Tuesday, pt heard pop, worsening rib pain)   HPI: Bethany Garza is a 28 y.o. female presenting on 01/27/2019 for Chest Pain (Left side, friend gave bear hug on Tuesday, pt heard pop, worsening rib pain)   1. Rib pain on left side   Pt presents today with complaints of left rib pain. Pt states her friend picked her up on Tuesday and squeezed her tight and she felt a pop. Pt states she has had intermittent pain since that time. States the pain is worse with deep breathing and certain movements. States she has been taking ibuprofen with some relief of the pain. No shortness of breath, chest pain, fever, chills, palpitations or cough.    Relevant past medical, surgical, family, and social history reviewed and updated as indicated.  Allergies and medications reviewed and updated.   Past Medical History:  Diagnosis Date  . ADHD (attention deficit hyperactivity disorder)   . Wolf-Parkinson-White syndrome     Past Surgical History:  Procedure Laterality Date  . APPENDECTOMY    . CARDIAC ELECTROPHYSIOLOGY MAPPING AND ABLATION    . TONSILLECTOMY      Social History   Socioeconomic History  . Marital status: Single    Spouse name: Not on file  . Number of children: Not on file  . Years of education: Not on file  . Highest education level: Not on file  Occupational History  . Not on file  Social Needs  . Financial resource strain: Not on file  . Food insecurity:    Worry: Not on file    Inability: Not on file  . Transportation needs:    Medical: Not on file    Non-medical: Not on file  Tobacco Use  . Smoking status: Never Smoker  . Smokeless tobacco: Never Used  Substance and Sexual Activity  . Alcohol use: Yes  . Drug use: No  . Sexual activity: Not on file  Lifestyle  . Physical activity:    Days  per week: Not on file    Minutes per session: Not on file  . Stress: Not on file  Relationships  . Social connections:    Talks on phone: Not on file    Gets together: Not on file    Attends religious service: Not on file    Active member of club or organization: Not on file    Attends meetings of clubs or organizations: Not on file    Relationship status: Not on file  . Intimate partner violence:    Fear of current or ex partner: Not on file    Emotionally abused: Not on file    Physically abused: Not on file    Forced sexual activity: Not on file  Other Topics Concern  . Not on file  Social History Narrative  . Not on file    Outpatient Encounter Medications as of 01/27/2019  Medication Sig  . ALPRAZolam (XANAX) 0.25 MG tablet Take 1 tablet (0.25 mg total) by mouth at bedtime as needed for anxiety.  Marland Kitchen amphetamine-dextroamphetamine (ADDERALL XR) 20 MG 24 hr capsule Take 1 capsule (20 mg total) by mouth every morning.  Marland Kitchen amphetamine-dextroamphetamine (ADDERALL) 10 MG tablet Take 1 tablet (10 mg total) by mouth continuous as needed.  Marland Kitchen levonorgestrel (MIRENA) 20 MCG/24HR IUD 1 each  by Intrauterine route once.  Marland Kitchen amphetamine-dextroamphetamine (ADDERALL XR) 20 MG 24 hr capsule Take 1 capsule (20 mg total) by mouth daily. (Patient not taking: Reported on 01/27/2019)  . amphetamine-dextroamphetamine (ADDERALL XR) 20 MG 24 hr capsule Take 1 capsule (20 mg total) by mouth daily. (Patient not taking: Reported on 01/27/2019)  . ibuprofen (ADVIL,MOTRIN) 400 MG tablet Take 400 mg by mouth every 6 (six) hours as needed.  . naproxen (NAPROSYN) 500 MG tablet Take 1 tablet (500 mg total) by mouth 2 (two) times daily with a meal.  . [DISCONTINUED] erythromycin ophthalmic ointment Place 1 application into the right eye 4 (four) times daily.   No facility-administered encounter medications on file as of 01/27/2019.     No Known Allergies  Review of Systems  Constitutional: Negative for chills, fatigue  and fever.  Respiratory: Negative for cough, chest tightness and shortness of breath.   Cardiovascular: Negative for chest pain and palpitations.       Left rib pain  Skin: Negative for wound.  Psychiatric/Behavioral: Negative for confusion.  All other systems reviewed and are negative.       Objective:  BP 105/76   Pulse 79   Temp 98 F (36.7 C) (Oral)   Ht 5' 2"  (1.575 m)   Wt 155 lb 6.4 oz (70.5 kg)   BMI 28.42 kg/m    Wt Readings from Last 3 Encounters:  01/27/19 155 lb 6.4 oz (70.5 kg)  10/07/18 153 lb (69.4 kg)  10/05/18 153 lb 6.4 oz (69.6 kg)    Physical Exam Vitals signs and nursing note reviewed.  Constitutional:      General: She is not in acute distress.    Appearance: Normal appearance. She is well-developed. She is not ill-appearing or toxic-appearing.  HENT:     Head: Normocephalic and atraumatic.     Mouth/Throat:     Mouth: Mucous membranes are moist.     Pharynx: Oropharynx is clear.  Eyes:     Conjunctiva/sclera: Conjunctivae normal.     Pupils: Pupils are equal, round, and reactive to light.  Neck:     Musculoskeletal: Normal range of motion and neck supple.  Cardiovascular:     Rate and Rhythm: Normal rate and regular rhythm.     Chest Wall: PMI is not displaced.     Heart sounds: Normal heart sounds. No murmur. No friction rub. No gallop.   Pulmonary:     Effort: Pulmonary effort is normal. No accessory muscle usage or respiratory distress.     Breath sounds: Normal breath sounds.  Chest:     Chest wall: Tenderness (under left breast) present. No mass, deformity, crepitus or edema. There is no dullness to percussion.    Skin:    General: Skin is warm and dry.     Capillary Refill: Capillary refill takes less than 2 seconds.  Neurological:     General: No focal deficit present.     Mental Status: She is alert and oriented to person, place, and time.  Psychiatric:        Mood and Affect: Mood normal.        Behavior: Behavior normal.  Behavior is cooperative.        Thought Content: Thought content normal.        Judgment: Judgment normal.     Results for orders placed or performed in visit on 10/07/18  CMP14+EGFR  Result Value Ref Range   Glucose 94 65 - 99 mg/dL   BUN 9  6 - 20 mg/dL   Creatinine, Ser 0.77 0.57 - 1.00 mg/dL   GFR calc non Af Amer 106 >59 mL/min/1.73   GFR calc Af Amer 122 >59 mL/min/1.73   BUN/Creatinine Ratio 12 9 - 23   Sodium 144 134 - 144 mmol/L   Potassium 4.1 3.5 - 5.2 mmol/L   Chloride 104 96 - 106 mmol/L   CO2 23 20 - 29 mmol/L   Calcium 9.4 8.7 - 10.2 mg/dL   Total Protein 6.6 6.0 - 8.5 g/dL   Albumin 4.4 3.5 - 5.5 g/dL   Globulin, Total 2.2 1.5 - 4.5 g/dL   Albumin/Globulin Ratio 2.0 1.2 - 2.2   Bilirubin Total 0.3 0.0 - 1.2 mg/dL   Alkaline Phosphatase 59 39 - 117 IU/L   AST 17 0 - 40 IU/L   ALT 10 0 - 32 IU/L  CBC with Differential/Platelet  Result Value Ref Range   WBC 5.5 3.4 - 10.8 x10E3/uL   RBC 4.37 3.77 - 5.28 x10E6/uL   Hemoglobin 13.2 11.1 - 15.9 g/dL   Hematocrit 38.7 34.0 - 46.6 %   MCV 89 79 - 97 fL   MCH 30.2 26.6 - 33.0 pg   MCHC 34.1 31.5 - 35.7 g/dL   RDW 12.4 12.3 - 15.4 %   Platelets 207 150 - 450 x10E3/uL   Neutrophils 47 Not Estab. %   Lymphs 40 Not Estab. %   Monocytes 10 Not Estab. %   Eos 2 Not Estab. %   Basos 1 Not Estab. %   Neutrophils Absolute 2.6 1.4 - 7.0 x10E3/uL   Lymphocytes Absolute 2.2 0.7 - 3.1 x10E3/uL   Monocytes Absolute 0.5 0.1 - 0.9 x10E3/uL   EOS (ABSOLUTE) 0.1 0.0 - 0.4 x10E3/uL   Basophils Absolute 0.1 0.0 - 0.2 x10E3/uL   Immature Granulocytes 0 Not Estab. %   Immature Grans (Abs) 0.0 0.0 - 0.1 x10E3/uL  Lipid panel  Result Value Ref Range   Cholesterol, Total 171 100 - 199 mg/dL   Triglycerides 249 (H) 0 - 149 mg/dL   HDL 59 >39 mg/dL   VLDL Cholesterol Cal 50 (H) 5 - 40 mg/dL   LDL Calculated 62 0 - 99 mg/dL   Chol/HDL Ratio 2.9 0.0 - 4.4 ratio  TSH  Result Value Ref Range   TSH 1.400 0.450 - 4.500 uIU/mL    Varicella zoster antibody, IgG  Result Value Ref Range   Varicella zoster IgG 1,181 Immune >165 index  QuantiFERON-TB Gold Plus  Result Value Ref Range   QuantiFERON Incubation Incubation performed.    QuantiFERON Criteria Comment    QuantiFERON TB1 Ag Value 0.05 IU/mL   QuantiFERON TB2 Ag Value 0.05 IU/mL   QuantiFERON Nil Value 0.04 IU/mL   QuantiFERON Mitogen Value >10.00 IU/mL   QuantiFERON-TB Gold Plus Negative Negative     Left rib xray negative per radiology reading.   Pertinent labs & imaging results that were available during my care of the patient were reviewed by me and considered in my medical decision making.  Assessment & Plan:  Zuley was seen today for chest pain.  Diagnoses and all orders for this visit:  Rib pain on left side Negative xray in office today. Symptomatic care discussed. Medications as prescribed. Report any new or worsening symptoms.  -     naproxen (NAPROSYN) 500 MG tablet; Take 1 tablet (500 mg total) by mouth 2 (two) times daily with a meal.     Continue all other maintenance medications.  Follow up plan: Return if symptoms worsen or fail to improve.  Educational handout given for rib contusion   The above assessment and management plan was discussed with the patient. The patient verbalized understanding of and has agreed to the management plan. Patient is aware to call the clinic if symptoms persist or worsen. Patient is aware when to return to the clinic for a follow-up visit. Patient educated on when it is appropriate to go to the emergency department.   Monia Pouch, FNP-C [facility] [phone]

## 2019-03-22 ENCOUNTER — Ambulatory Visit (INDEPENDENT_AMBULATORY_CARE_PROVIDER_SITE_OTHER): Payer: BLUE CROSS/BLUE SHIELD | Admitting: Family

## 2019-03-22 ENCOUNTER — Other Ambulatory Visit: Payer: Self-pay

## 2019-03-22 ENCOUNTER — Encounter: Payer: Self-pay | Admitting: Family

## 2019-03-22 DIAGNOSIS — F1320 Sedative, hypnotic or anxiolytic dependence, uncomplicated: Secondary | ICD-10-CM

## 2019-03-22 DIAGNOSIS — F411 Generalized anxiety disorder: Secondary | ICD-10-CM | POA: Diagnosis not present

## 2019-03-22 DIAGNOSIS — Z79899 Other long term (current) drug therapy: Secondary | ICD-10-CM

## 2019-03-22 DIAGNOSIS — F909 Attention-deficit hyperactivity disorder, unspecified type: Secondary | ICD-10-CM | POA: Diagnosis not present

## 2019-03-22 DIAGNOSIS — F320 Major depressive disorder, single episode, mild: Secondary | ICD-10-CM

## 2019-03-22 MED ORDER — ESCITALOPRAM OXALATE 10 MG PO TABS: 10.0000 mg | ORAL_TABLET | Freq: Every day | ORAL | 3 refills | Status: DC

## 2019-03-22 MED ORDER — AMPHETAMINE-DEXTROAMPHET ER 20 MG PO CP24: 20.0000 mg | ORAL_CAPSULE | ORAL | 0 refills | Status: DC

## 2019-03-22 MED ORDER — AMPHETAMINE-DEXTROAMPHET ER 20 MG PO CP24: 20.0000 mg | ORAL_CAPSULE | Freq: Every day | ORAL | 0 refills | Status: DC

## 2019-03-22 MED ORDER — ALPRAZOLAM 0.25 MG PO TABS: 0.2500 mg | ORAL_TABLET | Freq: Every evening | ORAL | 1 refills | Status: DC | PRN

## 2019-03-22 MED ORDER — AMPHETAMINE-DEXTROAMPHETAMINE 10 MG PO TABS: 10.0000 mg | ORAL_TABLET | ORAL | 0 refills | Status: DC | PRN

## 2019-03-22 NOTE — Progress Notes (Signed)
Virtual Visit via telephone Note  I connected with Bethany Garza on 03/22/19 at 9:58 Am by telephone and verified that I am speaking with the correct person using two identifiers. Bethany Garza is currently located at home  and no one  is currently with her during visit. The provider, Jannifer Rodney, FNP is located in their office at time of visit.  I discussed the limitations, risks, security and privacy concerns of performing an evaluation and management service by telephone and the availability of in person appointments. I also discussed with the patient that there may be a patient responsible charge related to this service. The patient expressed understanding and agreed to proceed.   History and Present Illness:   Pt calls today for for chronic follow up. She states she has recently found out her dad has lung cancer with mets to his brain.  Anxiety  Presents for follow-up visit. Symptoms include decreased concentration, excessive worry, irritability, nervous/anxious behavior and restlessness. Symptoms occur most days. The severity of symptoms is moderate. The quality of sleep is good.    Depression         This is a recurrent problem.  The current episode started more than 1 year ago.   The onset quality is gradual.   The problem occurs intermittently.  The problem has been waxing and waning since onset.  Associated symptoms include decreased concentration, irritable, restlessness, decreased interest, appetite change and sad.  Associated symptoms include no helplessness and no hopelessness.  Past treatments include nothing.  Past medical history includes anxiety.   ADHD PT currently taking Adderall 20 mg and states she takes this while working and do well. States this helps her stay on task and focus at work.     Review of Systems  Constitutional: Positive for appetite change and irritability.  Psychiatric/Behavioral: Positive for decreased concentration and depression. The  patient is nervous/anxious.   All other systems reviewed and are negative.   Observations/Objective: No SOB or distress noted  Assessment and Plan: Bethany Garza comes in today with chief complaint of No chief complaint on file.   Diagnosis and orders addressed:  1. Attention deficit hyperactivity disorder (ADHD), unspecified ADHD type Meds as prescribed Behavior modification as needed Follow-up for recheck in 3 months - amphetamine-dextroamphetamine (ADDERALL XR) 20 MG 24 hr capsule; Take 1 capsule (20 mg total) by mouth every morning.  Dispense: 30 capsule; Refill: 0 - amphetamine-dextroamphetamine (ADDERALL) 10 MG tablet; Take 1 tablet (10 mg total) by mouth continuous as needed.  Dispense: 30 tablet; Refill: 0 - amphetamine-dextroamphetamine (ADDERALL XR) 20 MG 24 hr capsule; Take 1 capsule (20 mg total) by mouth daily.  Dispense: 30 capsule; Refill: 0 - amphetamine-dextroamphetamine (ADDERALL XR) 20 MG 24 hr capsule; Take 1 capsule (20 mg total) by mouth daily.  Dispense: 30 capsule; Refill: 0  2. Benzodiazepine dependence (HCC) - ALPRAZolam (XANAX) 0.25 MG tablet; Take 1 tablet (0.25 mg total) by mouth at bedtime as needed for anxiety.  Dispense: 30 tablet; Refill: 1  3. Controlled substance agreement signed - ALPRAZolam (XANAX) 0.25 MG tablet; Take 1 tablet (0.25 mg total) by mouth at bedtime as needed for anxiety.  Dispense: 30 tablet; Refill: 1  4. GAD (generalized anxiety disorder) Will start Lexapro 10 mg today Stress management discussed - ALPRAZolam (XANAX) 0.25 MG tablet; Take 1 tablet (0.25 mg total) by mouth at bedtime as needed for anxiety.  Dispense: 30 tablet; Refill: 1 - escitalopram (LEXAPRO) 10 MG tablet; Take 1  tablet (10 mg total) by mouth daily.  Dispense: 90 tablet; Refill: 3  5. Depression, major, single episode, mild (HCC) Will start Lexapro 10 mg today Stress management discussed - escitalopram (LEXAPRO) 10 MG tablet; Take 1 tablet (10 mg total)  by mouth daily.  Dispense: 90 tablet; Refill: 3    Pt reviewed in [place] controlled database- PT only takes xanax as needed and has only filled her rx 3 times in the last year. She only takes her Adderall while working and does not take on weekends.  Health Maintenance reviewed Diet and exercise encouraged  Follow up plan: 3 months        I discussed the assessment and treatment plan with the patient. The patient was provided an opportunity to ask questions and all were answered. The patient agreed with the plan and demonstrated an understanding of the instructions.   The patient was advised to call back or seek an in-person evaluation if the symptoms worsen or if the condition fails to improve as anticipated.  The above assessment and management plan was discussed with the patient. The patient verbalized understanding of and has agreed to the management plan. Patient is aware to call the clinic if symptoms persist or worsen. Patient is aware when to return to the clinic for a follow-up visit. Patient educated on when it is appropriate to go to the emergency department.    Call ended 10:14 AM, I provided 16 minutes of non-face-to-face time during this encounter.    Jannifer Rodneyhristy Dua Mehler, FNP

## 2019-03-23 ENCOUNTER — Telehealth: Payer: Self-pay | Admitting: *Deleted

## 2019-03-23 DIAGNOSIS — F909 Attention-deficit hyperactivity disorder, unspecified type: Secondary | ICD-10-CM

## 2019-03-23 NOTE — Telephone Encounter (Signed)
Fax from [facility] Please confirm maximun frequency Sig 1 tab continuous as needed

## 2019-03-24 MED ORDER — AMPHETAMINE-DEXTROAMPHETAMINE 10 MG PO TABS: 10.0000 mg | ORAL_TABLET | Freq: Every day | ORAL | 0 refills | Status: DC | PRN

## 2019-03-24 NOTE — Telephone Encounter (Signed)
Prescription sent to pharmacy.

## 2019-05-17 ENCOUNTER — Other Ambulatory Visit: Payer: Self-pay | Admitting: Family

## 2019-05-17 MED ORDER — ESCITALOPRAM OXALATE 20 MG PO TABS: 20.0000 mg | ORAL_TABLET | Freq: Every day | ORAL | 5 refills | Status: DC

## 2019-06-08 ENCOUNTER — Other Ambulatory Visit: Payer: Self-pay | Admitting: Family

## 2019-07-18 ENCOUNTER — Encounter: Payer: Self-pay | Admitting: Family Medicine

## 2019-08-12 ENCOUNTER — Encounter: Payer: Self-pay | Admitting: Family

## 2019-08-12 ENCOUNTER — Ambulatory Visit (INDEPENDENT_AMBULATORY_CARE_PROVIDER_SITE_OTHER): Payer: BC Managed Care – PPO | Admitting: Family

## 2019-08-12 DIAGNOSIS — F909 Attention-deficit hyperactivity disorder, unspecified type: Secondary | ICD-10-CM | POA: Diagnosis not present

## 2019-08-12 MED ORDER — AMPHETAMINE-DEXTROAMPHET ER 20 MG PO CP24: 20.0000 mg | ORAL_CAPSULE | Freq: Every day | ORAL | 0 refills | Status: DC

## 2019-08-12 MED ORDER — AMPHETAMINE-DEXTROAMPHETAMINE 10 MG PO TABS: 10.0000 mg | ORAL_TABLET | Freq: Every day | ORAL | 0 refills | Status: DC | PRN

## 2019-08-12 MED ORDER — AMPHETAMINE-DEXTROAMPHET ER 20 MG PO CP24: 20.0000 mg | ORAL_CAPSULE | ORAL | 0 refills | Status: DC

## 2019-08-12 NOTE — Progress Notes (Signed)
   Virtual Visit via telephone Note Due to COVID-19 pandemic this visit was conducted virtually. This visit type was conducted due to national recommendations for restrictions regarding the COVID-19 Pandemic (e.g. social distancing, sheltering in place) in an effort to limit this patient's exposure and mitigate transmission in our community. All issues noted in this document were discussed and addressed.  A physical exam was not performed with this format.  I connected with Bethany Garza on 08/12/19 at 3:18 pm by telephone and verified that I am speaking with the correct person using two identifiers. Bethany Garza is currently located athome* and no one is currently with her during visit. The provider, Evelina Dun, FNP is located in their office at time of visit.  I discussed the limitations, risks, security and privacy concerns of performing an evaluation and management service by telephone and the availability of in person appointments. I also discussed with the patient that there may be a patient responsible charge related to this service. The patient expressed understanding and agreed to proceed.   History and Present Illness:  HPI PT calls the office today for ADHD refill. She is currently taking Adderall  XR20 mg daily and then takes the Adderall 10 mg on the weekend. She states this is helping her stay focused at school and completing tasks. She denies any adverse effects.    Review of Systems  All other systems reviewed and are negative.    Observations/Objective: No SOB or distress noted  Assessment and Plan: 1. Attention deficit hyperactivity disorder (ADHD), unspecified ADHD type Meds as prescribed Behavior modification as needed Follow-up for recheck in 3 months Pt reviewed in [place] controlled database- No red flags noted - amphetamine-dextroamphetamine (ADDERALL XR) 20 MG 24 hr capsule; Take 1 capsule (20 mg total) by mouth daily.  Dispense: 30 capsule; Refill: 0  - amphetamine-dextroamphetamine (ADDERALL XR) 20 MG 24 hr capsule; Take 1 capsule (20 mg total) by mouth every morning.  Dispense: 30 capsule; Refill: 0 - amphetamine-dextroamphetamine (ADDERALL XR) 20 MG 24 hr capsule; Take 1 capsule (20 mg total) by mouth daily.  Dispense: 30 capsule; Refill: 0 - amphetamine-dextroamphetamine (ADDERALL) 10 MG tablet; Take 1 tablet (10 mg total) by mouth daily as needed.  Dispense: 30 tablet; Refill: 0     I discussed the assessment and treatment plan with the patient. The patient was provided an opportunity to ask questions and all were answered. The patient agreed with the plan and demonstrated an understanding of the instructions.   The patient was advised to call back or seek an in-person evaluation if the symptoms worsen or if the condition fails to improve as anticipated.  The above assessment and management plan was discussed with the patient. The patient verbalized understanding of and has agreed to the management plan. Patient is aware to call the clinic if symptoms persist or worsen. Patient is aware when to return to the clinic for a follow-up visit. Patient educated on when it is appropriate to go to the emergency department.   Time call ended:3:30 pm    I provided 12 minutes of non-face-to-face time during this encounter.    Evelina Dun, FNP

## 2019-10-04 IMAGING — DX DG RIBS W/ CHEST 3+V*L*
3 series · 3 of 3 positions shown · non-contrast
Comparison: 10/13/2007

CLINICAL DATA: Left-sided rib pain

EXAM:
LEFT RIBS AND CHEST - 3+ VIEW

[chest pa]
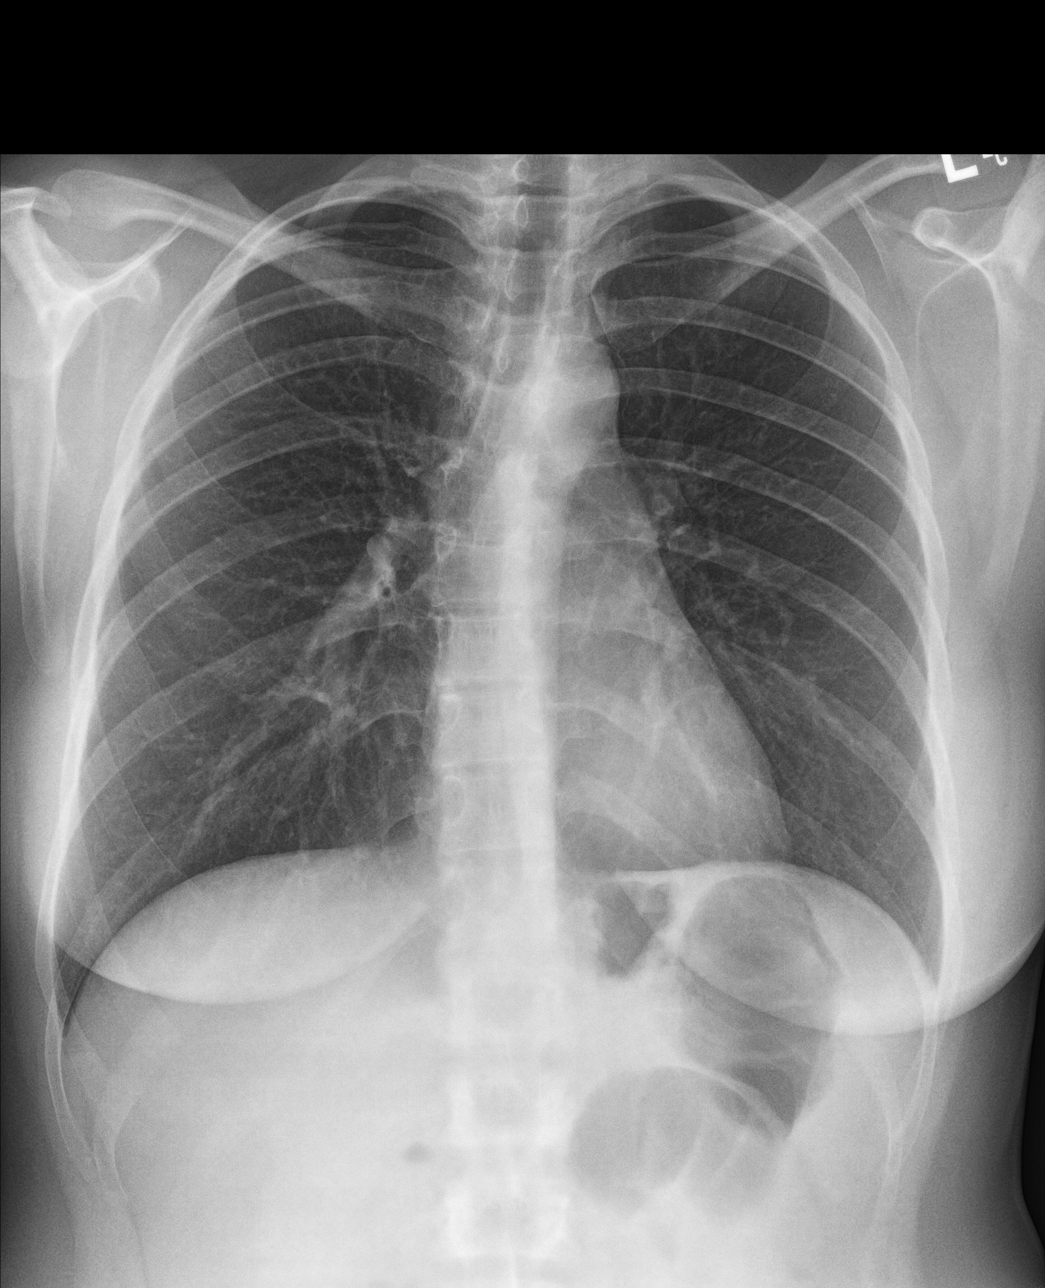

[rib obl (1 of 2)]
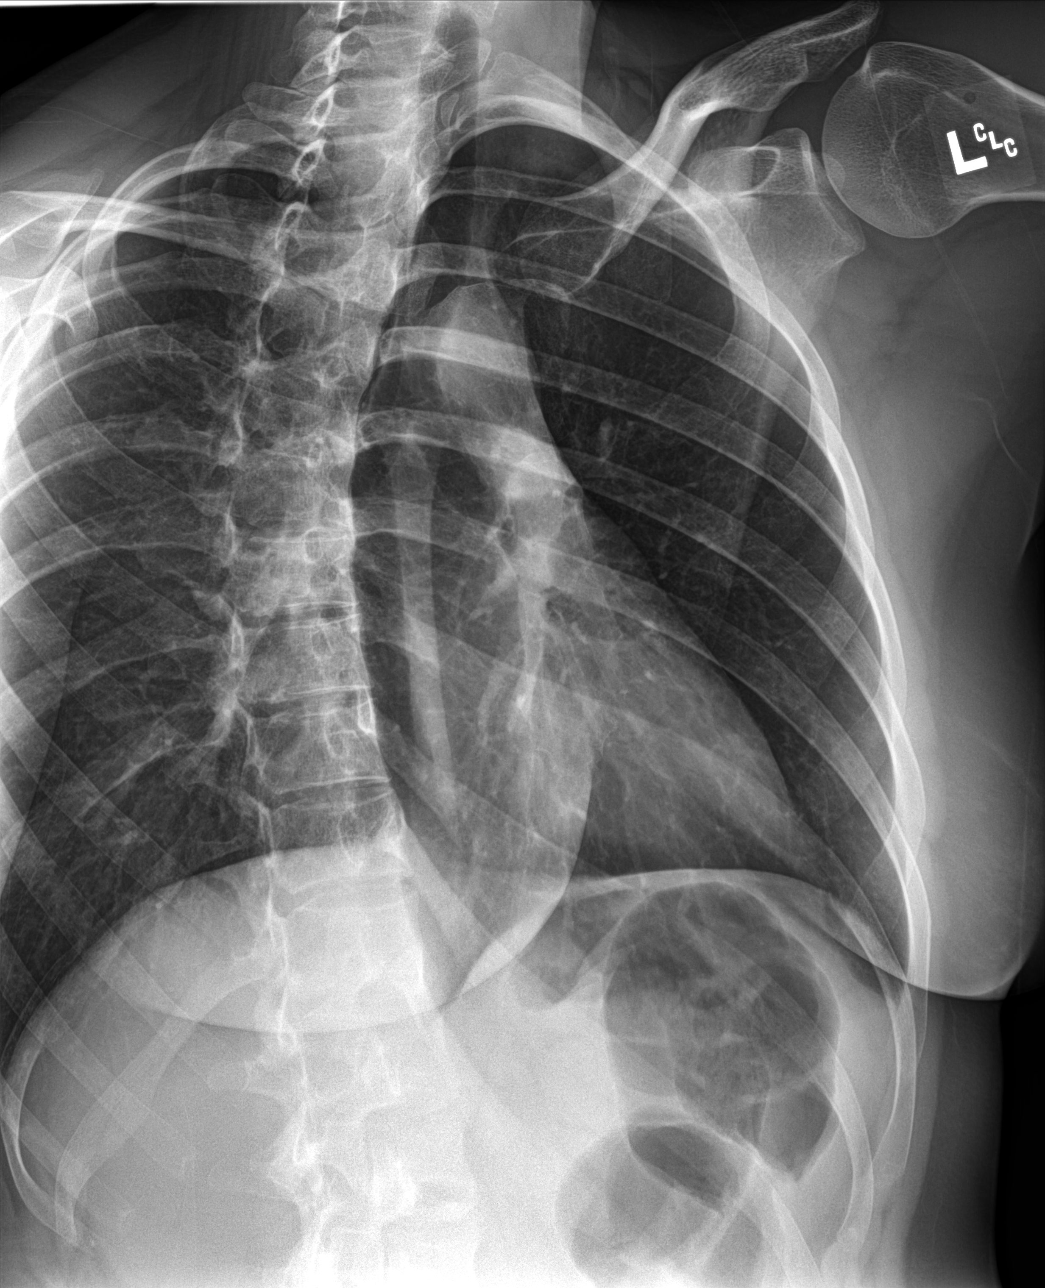

[rib obl (2 of 2)]
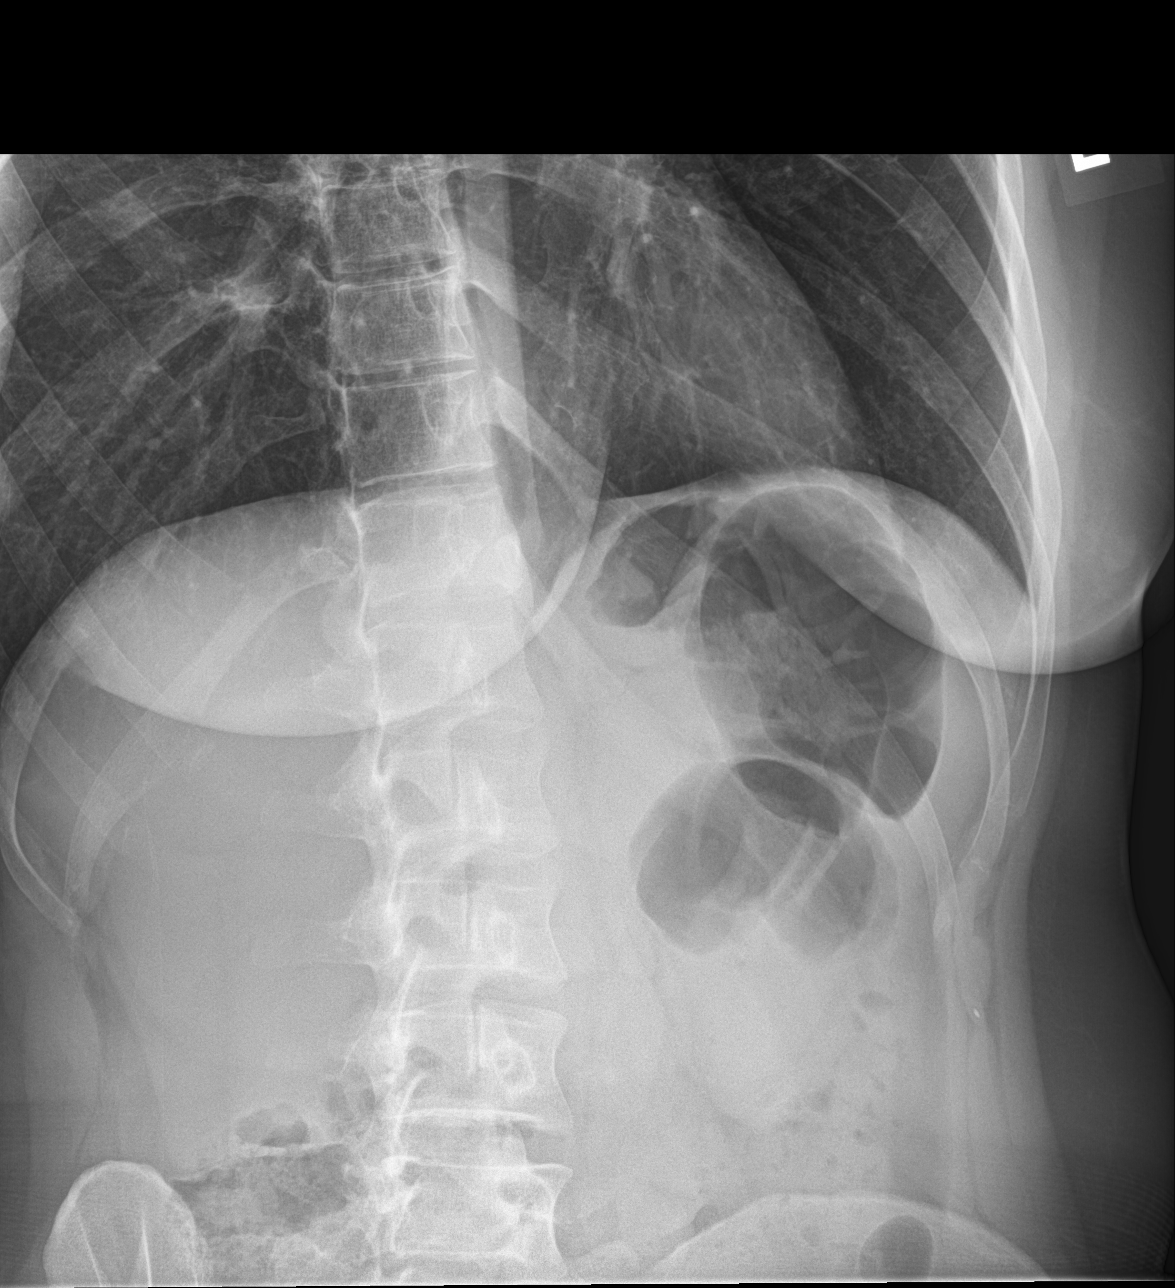

[3 of 3 positions shown; findings below may reference images not displayed]

FINDINGS: No fracture or other bone lesions are seen involving the ribs. There
is no evidence of pneumothorax or pleural effusion. Both lungs are
clear. Heart size and mediastinal contours are within normal limits.
IMPRESSION: Negative.

## 2020-01-29 ENCOUNTER — Other Ambulatory Visit: Payer: Self-pay | Admitting: Family

## 2020-02-13 ENCOUNTER — Other Ambulatory Visit: Payer: Self-pay | Admitting: Family

## 2020-02-20 ENCOUNTER — Telehealth: Payer: Self-pay | Admitting: Family

## 2020-02-20 NOTE — Telephone Encounter (Signed)
That is fine 

## 2020-02-20 NOTE — Telephone Encounter (Signed)
Patient aware.

## 2020-03-08 ENCOUNTER — Other Ambulatory Visit: Payer: Self-pay

## 2020-03-09 ENCOUNTER — Ambulatory Visit (INDEPENDENT_AMBULATORY_CARE_PROVIDER_SITE_OTHER): Payer: BC Managed Care – PPO | Admitting: Nurse Practitioner

## 2020-03-09 ENCOUNTER — Other Ambulatory Visit: Payer: Self-pay

## 2020-03-09 ENCOUNTER — Encounter: Payer: Self-pay | Admitting: Nurse Practitioner

## 2020-03-09 VITALS — BP 106/71 | HR 74 | Temp 98.2°F | Resp 20 | Ht 62.0 in | Wt 157.0 lb

## 2020-03-09 DIAGNOSIS — Z0001 Encounter for general adult medical examination with abnormal findings: Secondary | ICD-10-CM | POA: Diagnosis not present

## 2020-03-09 DIAGNOSIS — Z975 Presence of (intrauterine) contraceptive device: Secondary | ICD-10-CM

## 2020-03-09 DIAGNOSIS — F909 Attention-deficit hyperactivity disorder, unspecified type: Secondary | ICD-10-CM | POA: Diagnosis not present

## 2020-03-09 DIAGNOSIS — Z0000 Encounter for general adult medical examination without abnormal findings: Secondary | ICD-10-CM

## 2020-03-09 DIAGNOSIS — F320 Major depressive disorder, single episode, mild: Secondary | ICD-10-CM

## 2020-03-09 DIAGNOSIS — Z23 Encounter for immunization: Secondary | ICD-10-CM | POA: Diagnosis not present

## 2020-03-09 DIAGNOSIS — F411 Generalized anxiety disorder: Secondary | ICD-10-CM

## 2020-03-09 MED ORDER — ESCITALOPRAM OXALATE 20 MG PO TABS: 20.0000 mg | ORAL_TABLET | Freq: Every day | ORAL | 0 refills | Status: DC

## 2020-03-09 MED ORDER — AMPHETAMINE-DEXTROAMPHET ER 20 MG PO CP24: 20.0000 mg | ORAL_CAPSULE | Freq: Every day | ORAL | 0 refills | Status: AC

## 2020-03-09 MED ORDER — AMPHETAMINE-DEXTROAMPHET ER 20 MG PO CP24: 20.0000 mg | ORAL_CAPSULE | Freq: Every day | ORAL | 0 refills | Status: DC

## 2020-03-09 MED ORDER — BUSPIRONE HCL 10 MG PO TABS: 10.0000 mg | ORAL_TABLET | Freq: Three times a day (TID) | ORAL | 1 refills | Status: AC

## 2020-03-09 MED ORDER — AMPHETAMINE-DEXTROAMPHET ER 20 MG PO CP24: 20.0000 mg | ORAL_CAPSULE | ORAL | 0 refills | Status: AC

## 2020-03-09 MED ORDER — AMPHETAMINE-DEXTROAMPHET ER 20 MG PO CP24: 20.0000 mg | ORAL_CAPSULE | ORAL | 0 refills | Status: DC

## 2020-03-09 NOTE — Addendum Note (Signed)
Addended by: Cleda Daub on: 03/09/2020 03:09 PM   Modules accepted: Orders

## 2020-03-09 NOTE — Addendum Note (Signed)
Addended by: Bennie Pierini on: 03/09/2020 04:14 PM   Modules accepted: Orders

## 2020-03-09 NOTE — Progress Notes (Signed)
Subjective:    Patient ID: Bethany Garza, female    DOB: 1991-02-05, 29 y.o.   MRN: [id number]   Chief Complaint: Annual Exam    HPI:  1. Attention deficit hyperactivity disorder (ADHD), unspecified ADHD type Is taking Adderall XR 20 mg when she is going to be up all day but takes 10 mg if she starts her day late so it won't keep her up at night. Was doing this while in school but she is done with school now and will not really need the adderall 10 mg.   2. GAD (generalized anxiety disorder) Is still having anxiety. Is taking xanax about 3 times a week and it helps. Willing to wean off xanax and take buspar. GAD 7 : Generalized Anxiety Score 03/09/2020  Nervous, Anxious, on Edge 3  Control/stop worrying 3  Worry too much - different things 3  Trouble relaxing 3  Restless 0  Easily annoyed or irritable 0  Afraid - awful might happen 0  Total GAD 7 Score 12  Anxiety Difficulty Not difficult at all     3. Depression, major, single episode, mild (HCC) No symptoms of depression at this time. Takes lexapro daily and it is helping. Depression screen [facility] 2/9 03/09/2020 10/07/2018 10/05/2018  Decreased Interest 0 0 0  Down, Depressed, Hopeless 0 1 0  PHQ - 2 Score 0 1 0     4. IUD (intrauterine device) in place Does not have a period. Is not having any problems with IUD.    Outpatient Encounter Medications as of 03/09/2020  Medication Sig  . ALPRAZolam (XANAX) 0.25 MG tablet Take 0.25 mg by mouth at bedtime as needed for anxiety.  Marland Kitchen amphetamine-dextroamphetamine (ADDERALL XR) 20 MG 24 hr capsule Take 1 capsule (20 mg total) by mouth daily.  Marland Kitchen amphetamine-dextroamphetamine (ADDERALL XR) 20 MG 24 hr capsule Take 1 capsule (20 mg total) by mouth every morning.  Marland Kitchen amphetamine-dextroamphetamine (ADDERALL XR) 20 MG 24 hr capsule Take 1 capsule (20 mg total) by mouth daily.  Marland Kitchen amphetamine-dextroamphetamine (ADDERALL) 10 MG tablet Take 1 tablet (10 mg total) by mouth daily as  needed.  Marland Kitchen escitalopram (LEXAPRO) 20 MG tablet Take 1 tablet (20 mg total) by mouth daily. (Needs to be seen before next refill)  . levonorgestrel (MIRENA) 20 MCG/24HR IUD 1 each by Intrauterine route once.  . tretinoin (RETIN-A) 0.05 % cream Apply topically at bedtime.   No facility-administered encounter medications on file as of 03/09/2020.    Past Surgical History:  Procedure Laterality Date  . APPENDECTOMY    . CARDIAC ELECTROPHYSIOLOGY MAPPING AND ABLATION    . TONSILLECTOMY      Family History  Problem Relation Age of Onset  . 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  Controlled substance contract: 03/09/2020    Review of Systems  Constitutional: Negative.   HENT: Negative.   Eyes: Negative.   Respiratory: Negative.   Cardiovascular: Negative.   Gastrointestinal: Negative.   Endocrine: Negative.   Genitourinary: Negative.   Musculoskeletal: Negative.   Skin: Negative.   Allergic/Immunologic: Negative.   Neurological: Negative.   Hematological: Negative.   Psychiatric/Behavioral: The patient is nervous/anxious.        Objective:   Physical Exam Vitals and nursing note reviewed. Exam conducted with a chaperone present.  Constitutional:      Appearance: She is normal weight.  HENT:  Head: Normocephalic.     Right Ear: Tympanic membrane normal.     Left Ear: Tympanic membrane normal.     Nose: Nose normal.     Mouth/Throat:     Mouth: Mucous membranes are moist.     Pharynx: Oropharynx is clear.  Cardiovascular:     Rate and Rhythm: Normal rate and regular rhythm.     Pulses: Normal pulses.     Heart sounds: Normal heart sounds.  Pulmonary:     Effort: Pulmonary effort is normal.     Breath sounds: Normal breath sounds.  Abdominal:     General: Bowel sounds are normal.     Palpations: Abdomen is soft.  Genitourinary:    General: Normal vulva.       Vagina: No vaginal discharge.     Comments: cervix was non parous and pink mirena string visible No adnexal masses or tenderness Musculoskeletal:        General: Normal range of motion.     Cervical back: Normal range of motion.  Skin:    General: Skin is warm and dry.     Capillary Refill: Capillary refill takes less than 2 seconds.  Neurological:     Mental Status: She is alert and oriented to person, place, and time.  Psychiatric:        Mood and Affect: Mood normal.        Behavior: Behavior normal.     BP 106/71   Pulse 74   Temp 98.2 F (36.8 C) (Temporal)   Resp 20   Ht 5' 2"  (1.575 m)   Wt 157 lb (71.2 kg)   SpO2 98%   BMI 28.72 kg/m       Assessment & Plan:  Bethany Garza comes in today with chief complaint of Annual Exam   Diagnosis and orders addressed:  1. Annual physical exam - CBC with Differential/Platelet - CMP14+EGFR - Lipid panel - Thyroid Panel With TSH  2. Attention deficit hyperactivity disorder (ADHD), unspecified ADHD type Discontinue Adderall 10 mg and switch to just using Adderall 20 mg XR. - amphetamine-dextroamphetamine (ADDERALL XR) 20 MG 24 hr capsule; Take 1 capsule (20 mg total) by mouth daily.  Dispense: 30 capsule; Refill: 0 - amphetamine-dextroamphetamine (ADDERALL XR) 20 MG 24 hr capsule; Take 1 capsule (20 mg total) by mouth every morning.  Dispense: 30 capsule; Refill: 0 - amphetamine-dextroamphetamine (ADDERALL XR) 20 MG 24 hr capsule; Take 1 capsule (20 mg total) by mouth daily.  Dispense: 30 capsule; Refill: 0  3. GAD (generalized anxiety disorder) Stress management. Wean off xanax and start buspar. - busPIRone (BUSPAR) 10 MG tablet; Take 1 tablet (10 mg total) by mouth 3 (three) times daily.  Dispense: 30 tablet; Refill: 1  4. Depression, major, single episode, mild (HCC) Stress management - escitalopram (LEXAPRO) 20 MG tablet; Take 1 tablet (20 mg total) by mouth daily. (Needs to be seen before next refill)   Dispense: 30 tablet; Refill: 0  5. IUD (intrauterine device) in place - IGP, CtNg, rfx Aptima HPV ASCU   Labs pending Health Maintenance reviewed Diet and exercise encouraged  Follow up plan: 3 months   Bethany Garza-Margaret Hassell Done, FNP

## 2020-03-10 LAB — LIPID PANEL
Chol/HDL Ratio: 2.5 ratio (ref 0.0–4.4)
Cholesterol, Total: 194 mg/dL (ref 100–199)
HDL: 77 mg/dL (ref >39)
LDL Chol Calc (NIH): 105 mg/dL — ABNORMAL HIGH (ref 0–99)
Triglycerides: 65 mg/dL (ref 0–149)
VLDL Cholesterol Cal: 12 mg/dL (ref 5–40)

## 2020-03-10 LAB — CBC WITH DIFFERENTIAL/PLATELET
Basophils Absolute: 0.1 10*3/uL (ref 0.0–0.2)
Basos: 2 %
EOS (ABSOLUTE): 0.1 10*3/uL (ref 0.0–0.4)
Eos: 2 %
Hematocrit: 33.4 % — ABNORMAL LOW (ref 34.0–46.6)
Hemoglobin: 10.7 g/dL — ABNORMAL LOW (ref 11.1–15.9)
Immature Grans (Abs): 0 10*3/uL (ref 0.0–0.1)
Immature Granulocytes: 0 %
Lymphocytes Absolute: 1.7 10*3/uL (ref 0.7–3.1)
Lymphs: 37 %
MCH: 26.4 pg — ABNORMAL LOW (ref 26.6–33.0)
MCHC: 32 g/dL (ref 31.5–35.7)
MCV: 83 fL (ref 79–97)
Monocytes Absolute: 0.5 10*3/uL (ref 0.1–0.9)
Monocytes: 11 %
Neutrophils Absolute: 2.3 10*3/uL (ref 1.4–7.0)
Neutrophils: 48 %
Platelets: 207 10*3/uL (ref 150–450)
RBC: 4.05 x10E6/uL (ref 3.77–5.28)
RDW: 14.3 % (ref 11.7–15.4)
WBC: 4.6 10*3/uL (ref 3.4–10.8)

## 2020-03-10 LAB — CMP14+EGFR
ALT: 10 IU/L (ref 0–32)
AST: 19 IU/L (ref 0–40)
Albumin/Globulin Ratio: 2 (ref 1.2–2.2)
Albumin: 4.5 g/dL (ref 3.9–5.0)
Alkaline Phosphatase: 58 IU/L (ref 39–117)
BUN/Creatinine Ratio: 14 (ref 9–23)
BUN: 12 mg/dL (ref 6–20)
Bilirubin Total: 0.4 mg/dL (ref 0.0–1.2)
CO2: 23 mmol/L (ref 20–29)
Calcium: 9.5 mg/dL (ref 8.7–10.2)
Chloride: 104 mmol/L (ref 96–106)
Creatinine, Ser: 0.84 mg/dL (ref 0.57–1.00)
GFR calc Af Amer: 109 mL/min/{1.73_m2} (ref >59)
GFR calc non Af Amer: 94 mL/min/{1.73_m2} (ref >59)
Globulin, Total: 2.3 g/dL (ref 1.5–4.5)
Glucose: 102 mg/dL — ABNORMAL HIGH (ref 65–99)
Potassium: 4.2 mmol/L (ref 3.5–5.2)
Sodium: 138 mmol/L (ref 134–144)
Total Protein: 6.8 g/dL (ref 6.0–8.5)

## 2020-03-10 LAB — THYROID PANEL WITH TSH
Free Thyroxine Index: 2.1 (ref 1.2–4.9)
T3 Uptake Ratio: 27 % (ref 24–39)
T4, Total: 7.7 ug/dL (ref 4.5–12.0)
TSH: 1.81 u[IU]/mL (ref 0.450–4.500)

## 2020-03-12 ENCOUNTER — Encounter: Payer: Self-pay | Admitting: Nurse Practitioner

## 2020-03-12 LAB — IGP, CTNG, RFX APTIMA HPV ASCU
Chlamydia, Nuc. Acid Amp: NEGATIVE
Gonococcus by Nucleic Acid Amp: NEGATIVE

## 2020-03-13 ENCOUNTER — Other Ambulatory Visit: Payer: Self-pay | Admitting: Nurse Practitioner

## 2020-03-13 MED ORDER — TRETINOIN 0.05 % EX CREA: TOPICAL_CREAM | Freq: Every day | CUTANEOUS | 1 refills | Status: AC

## 2020-04-04 ENCOUNTER — Other Ambulatory Visit: Payer: Self-pay | Admitting: Family

## 2020-04-04 DIAGNOSIS — F320 Major depressive disorder, single episode, mild: Secondary | ICD-10-CM

## 2020-04-10 ENCOUNTER — Encounter: Payer: Self-pay | Admitting: Nurse Practitioner

## 2020-06-14 ENCOUNTER — Ambulatory Visit: Payer: Self-pay | Admitting: Nurse Practitioner

## 2020-07-16 ENCOUNTER — Other Ambulatory Visit: Payer: Self-pay | Admitting: Nurse Practitioner

## 2020-07-16 DIAGNOSIS — F320 Major depressive disorder, single episode, mild: Secondary | ICD-10-CM

## 2020-09-22 ENCOUNTER — Other Ambulatory Visit: Payer: Self-pay | Admitting: Nurse Practitioner

## 2020-09-22 DIAGNOSIS — F320 Major depressive disorder, single episode, mild: Secondary | ICD-10-CM

## 2020-10-15 ENCOUNTER — Other Ambulatory Visit: Payer: PRIVATE HEALTH INSURANCE

## 2020-10-15 DIAGNOSIS — Z20822 Contact with and (suspected) exposure to covid-19: Secondary | ICD-10-CM

## 2020-10-16 LAB — NOVEL CORONAVIRUS, NAA: SARS-CoV-2, NAA: DETECTED — AB

## 2020-10-16 LAB — SARS-COV-2, NAA 2 DAY TAT
